# Patient Record
Sex: Female | Born: 1950 | Race: White | Hispanic: No | Marital: Married | Smoking: Never smoker
Health system: Southern US, Community
[De-identification: ages and names within clinical notes are randomized; demographics above are authoritative.]

## PROBLEM LIST (undated history)

## (undated) DIAGNOSIS — E785 Hyperlipidemia, unspecified: Secondary | ICD-10-CM

## (undated) DIAGNOSIS — H409 Unspecified glaucoma: Secondary | ICD-10-CM

## (undated) DIAGNOSIS — M1990 Unspecified osteoarthritis, unspecified site: Secondary | ICD-10-CM

## (undated) HISTORY — DX: Hyperlipidemia, unspecified: E78.5

## (undated) HISTORY — PX: TUBAL LIGATION: SHX77

## (undated) HISTORY — DX: Unspecified glaucoma: H40.9

## (undated) HISTORY — DX: Unspecified osteoarthritis, unspecified site: M19.90

---

## 1998-05-06 ENCOUNTER — Other Ambulatory Visit: Admission: RE | Admit: 1998-05-06 | Discharge: 1998-05-06 | Payer: Self-pay | Admitting: Obstetrics and Gynecology

## 1999-06-04 ENCOUNTER — Other Ambulatory Visit: Admission: RE | Admit: 1999-06-04 | Discharge: 1999-06-04 | Payer: Self-pay | Admitting: Obstetrics and Gynecology

## 2000-08-17 ENCOUNTER — Other Ambulatory Visit: Admission: RE | Admit: 2000-08-17 | Discharge: 2000-08-17 | Payer: Self-pay | Admitting: Obstetrics and Gynecology

## 2001-08-31 ENCOUNTER — Other Ambulatory Visit: Admission: RE | Admit: 2001-08-31 | Discharge: 2001-08-31 | Payer: Self-pay | Admitting: Obstetrics and Gynecology

## 2002-09-05 ENCOUNTER — Other Ambulatory Visit: Admission: RE | Admit: 2002-09-05 | Discharge: 2002-09-05 | Payer: Self-pay | Admitting: Obstetrics and Gynecology

## 2003-05-17 ENCOUNTER — Encounter (INDEPENDENT_AMBULATORY_CARE_PROVIDER_SITE_OTHER): Payer: Self-pay | Admitting: Specialist

## 2003-05-17 ENCOUNTER — Ambulatory Visit (HOSPITAL_BASED_OUTPATIENT_CLINIC_OR_DEPARTMENT_OTHER): Admission: RE | Admit: 2003-05-17 | Discharge: 2003-05-17 | Payer: Self-pay | Admitting: Obstetrics and Gynecology

## 2003-05-17 ENCOUNTER — Ambulatory Visit (HOSPITAL_COMMUNITY): Admission: RE | Admit: 2003-05-17 | Discharge: 2003-05-17 | Payer: Self-pay | Admitting: Obstetrics and Gynecology

## 2003-09-13 ENCOUNTER — Other Ambulatory Visit: Admission: RE | Admit: 2003-09-13 | Discharge: 2003-09-13 | Payer: Self-pay | Admitting: Family Medicine

## 2003-12-07 ENCOUNTER — Ambulatory Visit (HOSPITAL_COMMUNITY): Admission: RE | Admit: 2003-12-07 | Discharge: 2003-12-07 | Payer: Self-pay | Admitting: Gastroenterology

## 2004-11-06 ENCOUNTER — Other Ambulatory Visit: Admission: RE | Admit: 2004-11-06 | Discharge: 2004-11-06 | Payer: Self-pay | Admitting: Family Medicine

## 2006-02-01 ENCOUNTER — Other Ambulatory Visit: Admission: RE | Admit: 2006-02-01 | Discharge: 2006-02-01 | Payer: Self-pay | Admitting: Family Medicine

## 2006-11-30 ENCOUNTER — Encounter: Admission: RE | Admit: 2006-11-30 | Discharge: 2006-11-30 | Payer: Self-pay | Admitting: Family Medicine

## 2007-02-10 ENCOUNTER — Other Ambulatory Visit: Admission: RE | Admit: 2007-02-10 | Discharge: 2007-02-10 | Payer: Self-pay | Admitting: Family Medicine

## 2007-12-01 ENCOUNTER — Encounter: Admission: RE | Admit: 2007-12-01 | Discharge: 2007-12-01 | Payer: Self-pay | Admitting: Family Medicine

## 2008-03-12 ENCOUNTER — Other Ambulatory Visit: Admission: RE | Admit: 2008-03-12 | Discharge: 2008-03-12 | Payer: Self-pay | Admitting: Family Medicine

## 2008-12-04 ENCOUNTER — Encounter: Admission: RE | Admit: 2008-12-04 | Discharge: 2008-12-04 | Payer: Self-pay | Admitting: Family Medicine

## 2009-12-05 ENCOUNTER — Encounter: Admission: RE | Admit: 2009-12-05 | Discharge: 2009-12-05 | Payer: Self-pay | Admitting: Family Medicine

## 2010-07-18 NOTE — Op Note (Signed)
NAME:  Christina Higgins, Christina Higgins                     ACCOUNT NO.:  [id number]   MEDICAL RECORD NO.:  [id number]                   PATIENT TYPE:  AMB   LOCATION:  [facility]                                 FACILITY:  [facility]   PHYSICIAN:  Daniel L. Eda Paschal, M.D.           DATE OF BIRTH:  12/14/1950   DATE OF PROCEDURE:  05/17/2003  DATE OF DISCHARGE:                                 OPERATIVE REPORT   PREOPERATIVE DIAGNOSES:  1. Postmenopausal bleeding.  2. Endometrial polyp.   POSTOPERATIVE DIAGNOSES:  1. Postmenopausal bleeding.  2. Endometrial polyp.   OPERATION:  Hysteroscopy with excision of endometrial polyp.   SURGEON:  Dr. Eda Paschal.   ANESTHESIA:  General.   INDICATIONS:  The patient is a 61 year old, gravida 2, para 2, AB 2, who  present to the office with postmenopausal bleeding.  Endometrial biopsy was  benign.  SIH was done because a portion of the biopsy showed a suggestion of  an endometrial polyp.  On SIH, the patient had a 2 cm endometrial polyp  which was still in place in spite of the biopsy.  She now enters the  hospital for the excision of the above.   FINDINGS:  External and vaginal exam was normal.  Cervix is clean.  Uterus  is top normal size and shape with 1.5 degrees of uterine descensus.  Adnexa  are palpably normal.  At the time of hysteroscopy, the patient had a large,  2 cm endometrial polyp coming off the left lateral wall of the fundus.  Once  this was removed, top of the fundus, tubal ostia, anterior and posterior  walls of the fundus, lower uterine segment, and endocervical canal were free  of disease.   DESCRIPTION OF PROCEDURE:  After adequate general anesthesia, the patient  was placed in the dorsal lithotomy position, prepped and draped in the usual  sterile manner.  A single-tooth tenaculum was placed on the anterior lip of  the cervix.  The cervix was dilated to a #31 Pratt dilator.  A hysteroscopic  examination was done with the hysteroscope  resectoscope, using 3% sorbitol  to expand the intrauterine cavity and a camera for magnification.  Findings  were as noted above.  A 90-degree wire loop with setting 60 coags, 70 cut  was utilized, and the polyp was excised with the 90-degree wire loop and  sent to pathology for tissue diagnosis.  There was no bleeding noted.  There  was no other endometrium that required sampling.  The procedure was  terminated.  Fluid deficit was less than 100 mL.  Blood loss was less than  20 mL.  The patient tolerated the procedure well and left the operating room  in satisfactory condition.  Daniel L. Eda Paschal, M.D.    Tonette Bihari  D:  05/17/2003  T:  05/17/2003  Job:  [id number]

## 2010-07-18 NOTE — Op Note (Signed)
NAMECHRISHELLE, ZITO           ACCOUNT NO.:  [id number]   MEDICAL RECORD NO.:  [id number]          PATIENT TYPE:  AMB   LOCATION:  ENDO                         FACILITY:  [facility]   PHYSICIAN:  Graylin Shiver, M.D.   DATE OF BIRTH:  1950/04/27   DATE OF PROCEDURE:  12/07/2003  DATE OF DISCHARGE:                                 OPERATIVE REPORT   PROCEDURE:  Colonoscopy.   INDICATION:  Screening.   Informed consent was obtained after explanation of the risks of bleeding,  infection, and perforation.   PREMEDICATIONS:  1.  Fentanyl 100 mcg IV.  2.  Versed 10 mg IV.   DESCRIPTION OF PROCEDURE:  With the patient in the left lateral decubitus  position, a rectal exam was performed.  No masses were felt.  The Olympus  colonoscope was inserted into the rectum and advanced around the colon to  the cecum.  Cecal landmarks were identified.  The cecum showed a small focal  telangiectasia.  The ascending colon was normal.  The transverse colon was  normal.  The descending colon, sigmoid, and rectum were normal.  She  tolerated the procedure well without complications.   IMPRESSION:  Small focal telangiectasia in the cecum.  Otherwise normal  colonoscopy to the cecum.   I would recommend a follow up screening colonoscopy again in 10 years.      SFG/MEDQ  D:  12/07/2003  T:  12/07/2003  Job:  [id number]   cc:   Stacie Acres. White, M.D.  [address]  [place]  [place] [address]  Fax: [phone]

## 2010-12-17 ENCOUNTER — Other Ambulatory Visit: Payer: Self-pay | Admitting: Family Medicine

## 2010-12-17 DIAGNOSIS — Z1231 Encounter for screening mammogram for malignant neoplasm of breast: Secondary | ICD-10-CM

## 2010-12-24 ENCOUNTER — Ambulatory Visit
Admission: RE | Admit: 2010-12-24 | Discharge: 2010-12-24 | Disposition: A | Discharge status: Home / Self Care | Payer: Federal, State, Local not specified - PPO | Source: Ambulatory Visit | Attending: Family Medicine | Admitting: Family Medicine

## 2010-12-24 DIAGNOSIS — Z1231 Encounter for screening mammogram for malignant neoplasm of breast: Secondary | ICD-10-CM

## 2011-03-30 ENCOUNTER — Other Ambulatory Visit: Payer: Self-pay | Admitting: Family Medicine

## 2011-03-30 ENCOUNTER — Other Ambulatory Visit (HOSPITAL_COMMUNITY)
Admission: RE | Admit: 2011-03-30 | Discharge: 2011-03-30 | Disposition: A | Discharge status: Home / Self Care | Payer: Federal, State, Local not specified - PPO | Source: Ambulatory Visit | Attending: Family Medicine | Admitting: Family Medicine

## 2011-03-30 DIAGNOSIS — Z0000 Encounter for general adult medical examination without abnormal findings: Secondary | ICD-10-CM | POA: Insufficient documentation

## 2011-12-31 ENCOUNTER — Other Ambulatory Visit: Payer: Self-pay | Admitting: Family Medicine

## 2011-12-31 DIAGNOSIS — Z1231 Encounter for screening mammogram for malignant neoplasm of breast: Secondary | ICD-10-CM

## 2012-02-02 ENCOUNTER — Ambulatory Visit
Admission: RE | Admit: 2012-02-02 | Discharge: 2012-02-02 | Disposition: A | Discharge status: Home / Self Care | Payer: Federal, State, Local not specified - PPO | Source: Ambulatory Visit | Attending: Family Medicine | Admitting: Family Medicine

## 2012-02-02 DIAGNOSIS — Z1231 Encounter for screening mammogram for malignant neoplasm of breast: Secondary | ICD-10-CM

## 2013-01-23 ENCOUNTER — Other Ambulatory Visit: Payer: Self-pay

## 2013-01-23 DIAGNOSIS — Z1231 Encounter for screening mammogram for malignant neoplasm of breast: Secondary | ICD-10-CM

## 2013-02-24 ENCOUNTER — Ambulatory Visit
Admission: RE | Admit: 2013-02-24 | Discharge: 2013-02-24 | Disposition: A | Discharge status: Home / Self Care | Payer: Federal, State, Local not specified - PPO | Source: Ambulatory Visit

## 2013-02-24 DIAGNOSIS — Z1231 Encounter for screening mammogram for malignant neoplasm of breast: Secondary | ICD-10-CM

## 2014-02-05 ENCOUNTER — Other Ambulatory Visit: Payer: Self-pay

## 2014-02-05 DIAGNOSIS — Z1231 Encounter for screening mammogram for malignant neoplasm of breast: Secondary | ICD-10-CM

## 2014-03-07 ENCOUNTER — Ambulatory Visit
Admission: RE | Admit: 2014-03-07 | Discharge: 2014-03-07 | Disposition: A | Discharge status: Home / Self Care | Payer: Federal, State, Local not specified - PPO | Source: Ambulatory Visit

## 2014-03-07 DIAGNOSIS — Z1231 Encounter for screening mammogram for malignant neoplasm of breast: Secondary | ICD-10-CM

## 2014-05-14 ENCOUNTER — Other Ambulatory Visit: Payer: Self-pay | Admitting: Family Medicine

## 2014-05-14 ENCOUNTER — Other Ambulatory Visit (HOSPITAL_COMMUNITY)
Admission: RE | Admit: 2014-05-14 | Discharge: 2014-05-14 | Disposition: A | Discharge status: Home / Self Care | Payer: Federal, State, Local not specified - PPO | Source: Ambulatory Visit | Attending: Family Medicine | Admitting: Family Medicine

## 2014-05-14 DIAGNOSIS — Z124 Encounter for screening for malignant neoplasm of cervix: Secondary | ICD-10-CM | POA: Insufficient documentation

## 2014-05-16 LAB — CYTOLOGY - PAP

## 2015-02-25 ENCOUNTER — Ambulatory Visit (INDEPENDENT_AMBULATORY_CARE_PROVIDER_SITE_OTHER): Payer: Federal, State, Local not specified - PPO | Admitting: Physician Assistant

## 2015-02-25 VITALS — BP 118/70 | HR 103 | Temp 98.5°F | Resp 17 | Ht 60.0 in | Wt 153.0 lb

## 2015-02-25 DIAGNOSIS — J209 Acute bronchitis, unspecified: Secondary | ICD-10-CM | POA: Diagnosis not present

## 2015-02-25 MED ORDER — IPRATROPIUM BROMIDE 0.03 % NA SOLN: 2.0000 | Freq: Two times a day (BID) | NASAL | Status: DC

## 2015-02-25 MED ORDER — HYDROCOD POLST-CPM POLST ER 10-8 MG/5ML PO SUER: 5.0000 mL | Freq: Two times a day (BID) | ORAL | Status: DC | PRN

## 2015-02-25 MED ORDER — BENZONATATE 100 MG PO CAPS: 100.0000 mg | ORAL_CAPSULE | Freq: Three times a day (TID) | ORAL | Status: DC | PRN

## 2015-02-25 NOTE — Progress Notes (Signed)
[facility] [address], [place] [address] [phone]  Date:  02/25/2015   Name:  Christina Higgins   DOB:  1950/11/06   MRN:  [id number]  PCP:  No primary care provider on file.    Chief Complaint: Cough and URI   History of Present Illness:  This is a 64 y.o. female with PMH HLD and glaucoma who is presenting with cough x 1 week. Started with a sore throat and then cough started. Having a lot of nasal congestion, drainage is greenish/brown. States she feels more congested in her head rather than in her chest. No sob or wheezing. Having left otalgia, mild. Throat feels scratchy. No fever or chills. She states she symptoms are staying the same since onset. She states she feels "a little run down".  Aggravating/alleviating factors: taking dayquil every 4-6 hours. History of asthma: no History of env allergies: no Tobacco use: Not a smoker.   Review of Systems:  Review of Systems See HPI  There are no active problems to display for this patient.   Prior to Admission medications   Medication Sig Start Date End Date Taking? Authorizing Provider  atorvastatin (LIPITOR) 40 MG tablet Take 40 mg by mouth daily.   Yes Historical Provider, MD    Allergies  Allergen Reactions  . Septra [Sulfamethoxazole-Trimethoprim] Rash    Past Surgical History  Procedure Laterality Date  . Tubal ligation      Social History  Substance Use Topics  . Smoking status: Never Smoker   . Smokeless tobacco: None  . Alcohol Use: No    Family History  Problem Relation Age of Onset  . Cancer Mother   . Heart disease Mother   . Hypertension Mother   . Stroke Mother   . Cancer Father     Medication list has been reviewed and updated.  Physical Examination:  Physical Exam  Constitutional: She is oriented to person, place, and time. She appears well-developed and well-nourished. No distress.  HENT:  Head: Normocephalic and atraumatic.  Right Ear: Hearing,  external ear and ear canal normal. Tympanic membrane is retracted.  Left Ear: Hearing, external ear and ear canal normal. Tympanic membrane is retracted.  Nose: Nose normal. Right sinus exhibits no maxillary sinus tenderness and no frontal sinus tenderness. Left sinus exhibits no maxillary sinus tenderness and no frontal sinus tenderness.  Mouth/Throat: Uvula is midline and mucous membranes are normal. Posterior oropharyngeal erythema present. No oropharyngeal exudate or posterior oropharyngeal edema.  Eyes: Conjunctivae and lids are normal. Right eye exhibits no discharge. Left eye exhibits no discharge. No scleral icterus.  Cardiovascular: Normal rate, regular rhythm, normal heart sounds and normal pulses.   No murmur heard. Pulmonary/Chest: Effort normal and breath sounds normal. No respiratory distress. She has no wheezes. She has no rhonchi. She has no rales.  Musculoskeletal: Normal range of motion.  Lymphadenopathy:       Head (right side): No submental, no submandibular and no tonsillar adenopathy present.       Head (left side): No submental, no submandibular and no tonsillar adenopathy present.    She has no cervical adenopathy.  Neurological: She is alert and oriented to person, place, and time.  Skin: Skin is warm, dry and intact. No lesion and no rash noted.  Psychiatric: She has a normal mood and affect. Her speech is normal and behavior is normal. Thought content normal.   BP 118/70 mmHg  Pulse 103  Temp(Src) 98.5 F (36.9  C) (Oral)  Resp 17  Ht 5' (1.524 m)  Wt 153 lb (69.4 kg)  BMI 29.88 kg/m2  SpO2 97%  Assessment and Plan:  1. Acute bronchitis, unspecified organism Will treat a likely viral bronchitis and uri with tussionex, tessalon and atrovent. If symptoms not improving in 5 days, she will call and I will call in abx. - chlorpheniramine-HYDROcodone (TUSSIONEX PENNKINETIC ER) 10-8 MG/5ML SUER; Take 5 mLs by mouth every 12 (twelve) hours as needed for cough.   Dispense: 100 mL; Refill: 0 - benzonatate (TESSALON) 100 MG capsule; Take 1-2 capsules (100-200 mg total) by mouth 3 (three) times daily as needed for cough.  Dispense: 40 capsule; Refill: 0 - ipratropium (ATROVENT) 0.03 % nasal spray; Place 2 sprays into both nostrils 2 (two) times daily.  Dispense: 30 mL; Refill: 0   Shelitha Magley V. Dyke Brackett, MHS [facility]  02/25/2015

## 2015-02-25 NOTE — Progress Notes (Deleted)
[facility] [address], [place] [address] [phone]  Date:  02/25/2015   Name:  Christina Higgins   DOB:  [birth year]   MRN:  [id number]  PCP:  No primary care provider on file.    Chief Complaint: Cough and URI   History of Present Illness:  This is a 64 y.o. female with PMH HLD who is presenting with cough.  Cough: *** SOB/wheezing: *** Nasal congestion: *** Otalgia: *** Sore throat: *** Fever/chills: *** Aggravating/alleviating factors: *** History of asthma: *** History of env allergies: *** Tobacco use: ***   Review of Systems:  Review of Systems See HPI  There are no active problems to display for this patient.   Prior to Admission medications   Medication Sig Start Date End Date Taking? Authorizing Provider  atorvastatin (LIPITOR) 40 MG tablet Take 40 mg by mouth daily.   Yes Historical Provider, MD    Allergies  Allergen Reactions  . Septra [Sulfamethoxazole-Trimethoprim] Rash    Past Surgical History  Procedure Laterality Date  . Tubal ligation      Social History  Substance Use Topics  . Smoking status: Never Smoker   . Smokeless tobacco: None  . Alcohol Use: No    Family History  Problem Relation Age of Onset  . Cancer Mother   . Heart disease Mother   . Hypertension Mother   . Stroke Mother   . Cancer Father     Medication list has been reviewed and updated.  Physical Examination:  Physical Exam  BP 118/70 mmHg  Pulse 103  Temp(Src) 98.5 F (36.9 C) (Oral)  Resp 17  Ht 5' (1.524 m)  Wt 153 lb (69.4 kg)  BMI 29.88 kg/m2  SpO2 97%  Assessment and Plan:

## 2015-02-25 NOTE — Patient Instructions (Signed)
Drink plenty of water (64 oz/day) and get plenty of rest. If you have been prescribed a cough syrup, do not drive or operate heavy machinery while using this medication. If you have been prescribed a nasal spray, use twice a day. Tessalon during the day for cough. If your symptoms are not improving in 5 days, let me know and I will call in an antibiotic.

## 2015-02-28 NOTE — Addendum Note (Signed)
Addended by: Carmelina DaneANDERSON, Ynez Eugenio S on: 02/28/2015 05:24 PM   Modules accepted: Kipp BroodSmartSet

## 2015-02-28 NOTE — Progress Notes (Signed)
  Medical screening examination/treatment/procedure(s) were performed by non-physician practitioner and as supervising physician I was immediately available for consultation/collaboration.     

## 2015-03-15 ENCOUNTER — Other Ambulatory Visit (HOSPITAL_BASED_OUTPATIENT_CLINIC_OR_DEPARTMENT_OTHER): Payer: Self-pay

## 2015-03-15 DIAGNOSIS — Z1231 Encounter for screening mammogram for malignant neoplasm of breast: Secondary | ICD-10-CM

## 2015-03-28 ENCOUNTER — Ambulatory Visit
Admission: RE | Admit: 2015-03-28 | Discharge: 2015-03-28 | Disposition: A | Discharge status: Home / Self Care | Payer: Federal, State, Local not specified - PPO | Source: Ambulatory Visit

## 2015-03-28 DIAGNOSIS — Z1231 Encounter for screening mammogram for malignant neoplasm of breast: Secondary | ICD-10-CM

## 2015-04-01 ENCOUNTER — Other Ambulatory Visit: Payer: Self-pay | Admitting: Family Medicine

## 2015-04-01 DIAGNOSIS — R928 Other abnormal and inconclusive findings on diagnostic imaging of breast: Secondary | ICD-10-CM

## 2015-04-05 ENCOUNTER — Ambulatory Visit
Admission: RE | Admit: 2015-04-05 | Discharge: 2015-04-05 | Disposition: A | Discharge status: Home / Self Care | Payer: Federal, State, Local not specified - PPO | Source: Ambulatory Visit | Attending: Family Medicine | Admitting: Family Medicine

## 2015-04-05 DIAGNOSIS — R928 Other abnormal and inconclusive findings on diagnostic imaging of breast: Secondary | ICD-10-CM

## 2015-09-11 ENCOUNTER — Other Ambulatory Visit: Payer: Self-pay | Admitting: Family Medicine

## 2015-09-11 DIAGNOSIS — N6489 Other specified disorders of breast: Secondary | ICD-10-CM

## 2015-10-04 ENCOUNTER — Ambulatory Visit
Admission: RE | Admit: 2015-10-04 | Discharge: 2015-10-04 | Disposition: A | Discharge status: Home / Self Care | Payer: Federal, State, Local not specified - PPO | Source: Ambulatory Visit | Attending: Family Medicine | Admitting: Family Medicine

## 2015-10-04 DIAGNOSIS — N6489 Other specified disorders of breast: Secondary | ICD-10-CM

## 2015-10-28 ENCOUNTER — Other Ambulatory Visit: Payer: Self-pay | Admitting: Family Medicine

## 2016-02-25 ENCOUNTER — Other Ambulatory Visit: Payer: Self-pay | Admitting: Family Medicine

## 2016-02-25 DIAGNOSIS — N63 Unspecified lump in unspecified breast: Secondary | ICD-10-CM

## 2016-04-06 ENCOUNTER — Other Ambulatory Visit: Payer: Federal, State, Local not specified - PPO

## 2016-04-13 ENCOUNTER — Ambulatory Visit
Admission: RE | Admit: 2016-04-13 | Discharge: 2016-04-13 | Disposition: A | Discharge status: Home / Self Care | Payer: Federal, State, Local not specified - PPO | Source: Ambulatory Visit | Attending: Family Medicine | Admitting: Family Medicine

## 2016-04-13 DIAGNOSIS — N63 Unspecified lump in unspecified breast: Secondary | ICD-10-CM

## 2017-03-30 ENCOUNTER — Other Ambulatory Visit: Payer: Self-pay | Admitting: Family Medicine

## 2017-03-30 DIAGNOSIS — Z1231 Encounter for screening mammogram for malignant neoplasm of breast: Secondary | ICD-10-CM

## 2017-04-21 ENCOUNTER — Ambulatory Visit
Admission: RE | Admit: 2017-04-21 | Discharge: 2017-04-21 | Disposition: A | Discharge status: Home / Self Care | Payer: Federal, State, Local not specified - PPO | Source: Ambulatory Visit | Attending: Family Medicine | Admitting: Family Medicine

## 2017-04-21 DIAGNOSIS — Z1231 Encounter for screening mammogram for malignant neoplasm of breast: Secondary | ICD-10-CM

## 2017-07-15 ENCOUNTER — Other Ambulatory Visit: Payer: Self-pay | Admitting: Family Medicine

## 2017-07-15 ENCOUNTER — Other Ambulatory Visit (HOSPITAL_COMMUNITY)
Admission: RE | Admit: 2017-07-15 | Discharge: 2017-07-15 | Disposition: A | Discharge status: Home / Self Care | Payer: Federal, State, Local not specified - PPO | Source: Ambulatory Visit | Attending: Family Medicine | Admitting: Family Medicine

## 2017-07-15 DIAGNOSIS — Z124 Encounter for screening for malignant neoplasm of cervix: Secondary | ICD-10-CM | POA: Insufficient documentation

## 2017-07-16 LAB — CYTOLOGY - PAP: Diagnosis: NEGATIVE

## 2018-04-29 ENCOUNTER — Other Ambulatory Visit: Payer: Self-pay | Admitting: Family Medicine

## 2018-04-29 DIAGNOSIS — Z1231 Encounter for screening mammogram for malignant neoplasm of breast: Secondary | ICD-10-CM

## 2018-05-24 ENCOUNTER — Ambulatory Visit: Payer: Federal, State, Local not specified - PPO

## 2018-06-21 ENCOUNTER — Ambulatory Visit: Payer: Federal, State, Local not specified - PPO

## 2018-08-05 ENCOUNTER — Ambulatory Visit
Admission: RE | Admit: 2018-08-05 | Discharge: 2018-08-05 | Disposition: A | Discharge status: Home / Self Care | Payer: Federal, State, Local not specified - PPO | Source: Ambulatory Visit | Attending: Family Medicine | Admitting: Family Medicine

## 2018-08-05 ENCOUNTER — Other Ambulatory Visit: Payer: Self-pay

## 2018-08-05 DIAGNOSIS — Z1231 Encounter for screening mammogram for malignant neoplasm of breast: Secondary | ICD-10-CM

## 2018-11-03 ENCOUNTER — Other Ambulatory Visit: Payer: Self-pay

## 2018-11-03 DIAGNOSIS — Z20822 Contact with and (suspected) exposure to covid-19: Secondary | ICD-10-CM

## 2018-11-04 LAB — NOVEL CORONAVIRUS, NAA: SARS-CoV-2, NAA: NOT DETECTED

## 2019-07-24 ENCOUNTER — Encounter: Payer: Self-pay | Admitting: Family Medicine

## 2019-07-24 ENCOUNTER — Other Ambulatory Visit: Payer: Self-pay | Admitting: Family Medicine

## 2019-07-24 DIAGNOSIS — Z1231 Encounter for screening mammogram for malignant neoplasm of breast: Secondary | ICD-10-CM

## 2019-07-24 DIAGNOSIS — E119 Type 2 diabetes mellitus without complications: Secondary | ICD-10-CM

## 2019-07-24 HISTORY — DX: Type 2 diabetes mellitus without complications: E11.9

## 2019-08-08 ENCOUNTER — Ambulatory Visit
Admission: RE | Admit: 2019-08-08 | Discharge: 2019-08-08 | Disposition: A | Discharge status: Home / Self Care | Payer: Medicare Other | Source: Ambulatory Visit | Attending: Family Medicine | Admitting: Family Medicine

## 2019-08-08 ENCOUNTER — Other Ambulatory Visit: Payer: Self-pay

## 2019-08-08 DIAGNOSIS — Z1231 Encounter for screening mammogram for malignant neoplasm of breast: Secondary | ICD-10-CM

## 2019-08-10 ENCOUNTER — Other Ambulatory Visit: Payer: Self-pay | Admitting: Family Medicine

## 2019-08-10 DIAGNOSIS — R928 Other abnormal and inconclusive findings on diagnostic imaging of breast: Secondary | ICD-10-CM

## 2019-08-15 ENCOUNTER — Ambulatory Visit: Payer: Medicare Other

## 2019-08-21 ENCOUNTER — Other Ambulatory Visit: Payer: Self-pay

## 2019-08-21 ENCOUNTER — Other Ambulatory Visit: Payer: Self-pay | Admitting: Family Medicine

## 2019-08-21 ENCOUNTER — Ambulatory Visit
Admission: RE | Admit: 2019-08-21 | Discharge: 2019-08-21 | Disposition: A | Discharge status: Home / Self Care | Payer: Medicare Other | Source: Ambulatory Visit | Attending: Family Medicine | Admitting: Family Medicine

## 2019-08-21 DIAGNOSIS — R928 Other abnormal and inconclusive findings on diagnostic imaging of breast: Secondary | ICD-10-CM

## 2019-08-22 ENCOUNTER — Ambulatory Visit: Payer: Medicare Other

## 2019-08-29 ENCOUNTER — Ambulatory Visit: Payer: Medicare Other

## 2019-08-30 ENCOUNTER — Encounter: Payer: Self-pay | Admitting: Dietician

## 2019-08-30 ENCOUNTER — Other Ambulatory Visit: Payer: Self-pay

## 2019-08-30 ENCOUNTER — Encounter: Payer: Medicare Other | Attending: Family Medicine | Admitting: Dietician

## 2019-08-30 DIAGNOSIS — E119 Type 2 diabetes mellitus without complications: Secondary | ICD-10-CM | POA: Diagnosis present

## 2019-08-30 NOTE — Patient Instructions (Signed)
Remember to limit carbohydrate intake to no more than 3-4 carb choices (or 45-60 grams of carbohydrates) at meals and 0-2 carb choices (or 0-30 grams of carbohydrates) at snacks.   Try to always include a source of protein with meals and snacks.   Remember the more fiber, the better!   Continue keeping an eye on nutrition facts labels and monitoring serving sizes. Good job with this!

## 2019-08-30 NOTE — Progress Notes (Signed)
Diabetes Self-Management Education  Visit Type: First/Initial  Appt. Start Time: 8:15am   Appt. End Time: 9:15am  08/30/2019  Christina Higgins, identified by name and date of birth, is a 69 y.o. female with a diagnosis of Diabetes: Type 2.   ASSESSMENT   Diabetes Self-Management Education - 08/30/19 0820      Visit Information   Visit Type First/Initial      Initial Visit   Diabetes Type Type 2    Are you currently following a meal plan? No    Are you taking your medications as prescribed? Not on Medications    Date Diagnosed 07/24/2019      Health Coping   How would you rate your overall health? Good      Psychosocial Assessment   Patient Belief/Attitude about Diabetes Motivated to manage diabetes    Self-care barriers None    Self-management support Doctor's office    Patient Concerns Nutrition/Meal planning;Weight Control    Special Needs None    Preferred Learning Style Visual    Learning Readiness Ready    How often do you need to have someone help you when you read instructions, pamphlets, or other written materials from your doctor or pharmacy? 1 - Never    What is the last grade level you completed in school? 2 years college      Complications   Last HgB A1C per patient/outside source 6.7 %   07/24/19   How often do you check your blood sugar? > 4 times/day   CGM; before breakfast/dinner, and 2 hrs after breakfast/dinner   Fasting Blood glucose range (mg/dL) 62-694    Postprandial Blood glucose range (mg/dL) 85-462;703-500    Have you had a dilated eye exam in the past 12 months? Yes    Have you had a dental exam in the past 12 months? Yes    Are you checking your feet? No      Exercise   Exercise Type Light (walking / raking leaves)    How many days per week to you exercise? 6    How many minutes per day do you exercise? 30    Total minutes per week of exercise 180      Patient Education   Previous Diabetes Education No    Disease state  Definition of  diabetes, type 1 and 2, and the diagnosis of diabetes;Factors that contribute to the development of diabetes;Explored patient's options for treatment of their diabetes    Nutrition management  Role of diet in the treatment of diabetes and the relationship between the three main macronutrients and blood glucose level;Food label reading, portion sizes and measuring food.;Carbohydrate counting;Reviewed blood glucose goals for pre and post meals and how to evaluate the patients' food intake on their blood glucose level.;Meal options for control of blood glucose level and chronic complications.    Physical activity and exercise  Role of exercise on diabetes management, blood pressure control and cardiac health.    Monitoring Taught/discussed recording of test results and interpretation of SMBG.;Interpreting lab values - A1C, lipid, urine microalbumina.;Identified appropriate SMBG and/or A1C goals.;Yearly dilated eye exam    Acute complications Taught treatment of hypoglycemia - the 15 rule.;Discussed and identified patients' treatment of hyperglycemia.    Chronic complications Relationship between chronic complications and blood glucose control    Psychosocial adjustment Role of stress on diabetes;Identified and addressed patients feelings and concerns about diabetes      Individualized Goals (developed by patient)   Nutrition Follow  meal plan discussed   3-4 carb choices per meal; 0-2 carb choices per snack; protein with meals/snacks   Physical Activity Exercise 5-7 days per week;30 minutes per day    Medications Not Applicable    Monitoring  test blood glucose pre and post meals as discussed      Outcomes   Expected Outcomes Demonstrated interest in learning. Expect positive outcomes           Individualized Plan for Diabetes Self-Management Training:  Learning Objective:  Patient will have a greater understanding of diabetes self-management. Patient education plan is to attend individual and/or  group sessions per assessed needs and concerns.   Plan:  Patient Instructions  Remember to limit carbohydrate intake to no more than 3-4 carb choices (or 45-60 grams of carbohydrates) at meals and 0-2 carb choices (or 0-30 grams of carbohydrates) at snacks.   Try to always include a source of protein with meals and snacks.   Remember the more fiber, the better!   Continue keeping an eye on nutrition facts labels and monitoring serving sizes. Good job with this!   Expected Outcomes:  Demonstrated interest in learning. Expect positive outcomes  Education material provided: ADA - How to Thrive: A Guide for Your Journey with Diabetes, Meal plan card, My Plate and Snack sheet  If problems or questions, patient to contact team via: Phone and Email  Future DSME appointment: PRN

## 2019-11-23 ENCOUNTER — Other Ambulatory Visit: Payer: Self-pay

## 2019-11-23 ENCOUNTER — Ambulatory Visit
Admission: RE | Admit: 2019-11-23 | Discharge: 2019-11-23 | Disposition: A | Discharge status: Home / Self Care | Payer: Medicare Other | Source: Ambulatory Visit | Attending: Family Medicine | Admitting: Family Medicine

## 2019-11-23 DIAGNOSIS — R928 Other abnormal and inconclusive findings on diagnostic imaging of breast: Secondary | ICD-10-CM

## 2020-05-10 ENCOUNTER — Other Ambulatory Visit: Payer: Self-pay | Admitting: Family Medicine

## 2020-05-10 DIAGNOSIS — R1032 Left lower quadrant pain: Secondary | ICD-10-CM

## 2020-05-28 ENCOUNTER — Ambulatory Visit
Admission: RE | Admit: 2020-05-28 | Discharge: 2020-05-28 | Disposition: A | Discharge status: Home / Self Care | Payer: Medicare Other | Source: Ambulatory Visit | Attending: Family Medicine | Admitting: Family Medicine

## 2020-05-28 DIAGNOSIS — R1032 Left lower quadrant pain: Secondary | ICD-10-CM

## 2020-05-28 MED ORDER — IOPAMIDOL (ISOVUE-300) INJECTION 61%
100.0000 mL | Freq: Once | INTRAVENOUS | Status: AC | PRN
  Administered 2020-05-28: 100 mL via INTRAVENOUS

## 2020-08-01 ENCOUNTER — Other Ambulatory Visit: Payer: Self-pay | Admitting: Internal Medicine

## 2020-08-01 ENCOUNTER — Other Ambulatory Visit (HOSPITAL_COMMUNITY)
Admission: RE | Admit: 2020-08-01 | Discharge: 2020-08-01 | Disposition: A | Discharge status: Home / Self Care | Payer: Medicare Other | Source: Ambulatory Visit | Attending: Family Medicine | Admitting: Family Medicine

## 2020-08-01 ENCOUNTER — Other Ambulatory Visit: Payer: Self-pay | Admitting: Family Medicine

## 2020-08-01 DIAGNOSIS — Z1151 Encounter for screening for human papillomavirus (HPV): Secondary | ICD-10-CM | POA: Diagnosis not present

## 2020-08-01 DIAGNOSIS — M8580 Other specified disorders of bone density and structure, unspecified site: Secondary | ICD-10-CM

## 2020-08-01 DIAGNOSIS — M8588 Other specified disorders of bone density and structure, other site: Secondary | ICD-10-CM

## 2020-08-01 DIAGNOSIS — Z01419 Encounter for gynecological examination (general) (routine) without abnormal findings: Secondary | ICD-10-CM | POA: Insufficient documentation

## 2020-08-02 LAB — CYTOLOGY - PAP
Comment: NEGATIVE
Diagnosis: NEGATIVE
High risk HPV: NEGATIVE

## 2020-08-23 ENCOUNTER — Other Ambulatory Visit: Payer: Self-pay | Admitting: Family Medicine

## 2020-08-23 DIAGNOSIS — Z1231 Encounter for screening mammogram for malignant neoplasm of breast: Secondary | ICD-10-CM

## 2020-10-17 ENCOUNTER — Other Ambulatory Visit: Payer: Self-pay

## 2020-10-17 ENCOUNTER — Ambulatory Visit
Admission: RE | Admit: 2020-10-17 | Discharge: 2020-10-17 | Disposition: A | Discharge status: Home / Self Care | Payer: Medicare Other | Source: Ambulatory Visit | Attending: Family Medicine | Admitting: Family Medicine

## 2020-10-17 DIAGNOSIS — Z1231 Encounter for screening mammogram for malignant neoplasm of breast: Secondary | ICD-10-CM

## 2021-01-14 ENCOUNTER — Other Ambulatory Visit: Payer: Self-pay

## 2021-01-14 ENCOUNTER — Emergency Department (HOSPITAL_BASED_OUTPATIENT_CLINIC_OR_DEPARTMENT_OTHER): Payer: Medicare Other | Admitting: Radiology

## 2021-01-14 ENCOUNTER — Emergency Department (HOSPITAL_BASED_OUTPATIENT_CLINIC_OR_DEPARTMENT_OTHER)
Admission: EM | Admit: 2021-01-14 | Discharge: 2021-01-14 | Disposition: A | Discharge status: Home / Self Care | Payer: Medicare Other | Source: Home / Self Care | Attending: Emergency Medicine | Admitting: Emergency Medicine

## 2021-01-14 ENCOUNTER — Encounter (HOSPITAL_BASED_OUTPATIENT_CLINIC_OR_DEPARTMENT_OTHER): Payer: Self-pay

## 2021-01-14 DIAGNOSIS — R0981 Nasal congestion: Secondary | ICD-10-CM | POA: Insufficient documentation

## 2021-01-14 DIAGNOSIS — Z20822 Contact with and (suspected) exposure to covid-19: Secondary | ICD-10-CM | POA: Insufficient documentation

## 2021-01-14 DIAGNOSIS — R0902 Hypoxemia: Secondary | ICD-10-CM | POA: Diagnosis not present

## 2021-01-14 DIAGNOSIS — J3489 Other specified disorders of nose and nasal sinuses: Secondary | ICD-10-CM | POA: Insufficient documentation

## 2021-01-14 DIAGNOSIS — R519 Headache, unspecified: Secondary | ICD-10-CM | POA: Insufficient documentation

## 2021-01-14 DIAGNOSIS — R059 Cough, unspecified: Secondary | ICD-10-CM | POA: Insufficient documentation

## 2021-01-14 DIAGNOSIS — Z79899 Other long term (current) drug therapy: Secondary | ICD-10-CM | POA: Insufficient documentation

## 2021-01-14 DIAGNOSIS — E119 Type 2 diabetes mellitus without complications: Secondary | ICD-10-CM | POA: Insufficient documentation

## 2021-01-14 DIAGNOSIS — J189 Pneumonia, unspecified organism: Secondary | ICD-10-CM

## 2021-01-14 DIAGNOSIS — J8489 Other specified interstitial pulmonary diseases: Secondary | ICD-10-CM | POA: Diagnosis not present

## 2021-01-14 LAB — CBC
HCT: 40.1 % (ref 36.0–46.0)
Hemoglobin: 13 g/dL (ref 12.0–15.0)
MCH: 31 pg (ref 26.0–34.0)
MCHC: 32.4 g/dL (ref 30.0–36.0)
MCV: 95.5 fL (ref 80.0–100.0)
Platelets: 217 10*3/uL (ref 150–400)
RBC: 4.2 MIL/uL (ref 3.87–5.11)
RDW: 14.2 % (ref 11.5–15.5)
WBC: 4.7 10*3/uL (ref 4.0–10.5)
nRBC: 0 % (ref 0.0–0.2)

## 2021-01-14 LAB — COMPREHENSIVE METABOLIC PANEL
ALT: 22 U/L (ref 0–44)
AST: 31 U/L (ref 15–41)
Albumin: 3.8 g/dL (ref 3.5–5.0)
Alkaline Phosphatase: 50 U/L (ref 38–126)
Anion gap: 9 (ref 5–15)
BUN: 17 mg/dL (ref 8–23)
CO2: 28 mmol/L (ref 22–32)
Calcium: 9.5 mg/dL (ref 8.9–10.3)
Chloride: 102 mmol/L (ref 98–111)
Creatinine, Ser: 0.71 mg/dL (ref 0.44–1.00)
GFR, Estimated: >60 mL/min (ref >60)
Glucose, Bld: 137 mg/dL — ABNORMAL HIGH (ref 70–99)
Potassium: 4.2 mmol/L (ref 3.5–5.1)
Sodium: 139 mmol/L (ref 135–145)
Total Bilirubin: 0.7 mg/dL (ref 0.3–1.2)
Total Protein: 6.5 g/dL (ref 6.5–8.1)

## 2021-01-14 LAB — RESP PANEL BY RT-PCR (FLU A&B, COVID) ARPGX2
Influenza A by PCR: NEGATIVE
Influenza B by PCR: NEGATIVE
SARS Coronavirus 2 by RT PCR: NEGATIVE

## 2021-01-14 LAB — LACTIC ACID, PLASMA: Lactic Acid, Venous: 1.4 mmol/L (ref 0.5–1.9)

## 2021-01-14 MED ORDER — AZITHROMYCIN 250 MG PO TABS: 250.0000 mg | ORAL_TABLET | Freq: Every day | ORAL | 0 refills | Status: DC

## 2021-01-14 MED ORDER — GUAIFENESIN 100 MG/5ML PO LIQD
10.0000 mL | Freq: Once | ORAL | Status: AC
  Administered 2021-01-14: 10 mL via ORAL
  Filled 2021-01-14: qty 10

## 2021-01-14 MED ORDER — ACETAMINOPHEN 500 MG PO TABS
1000.0000 mg | ORAL_TABLET | Freq: Once | ORAL | Status: AC
  Administered 2021-01-14: 1000 mg via ORAL
  Filled 2021-01-14: qty 2

## 2021-01-14 MED ORDER — SODIUM CHLORIDE 0.9 % IV SOLN
500.0000 mg | Freq: Once | INTRAVENOUS | Status: AC
  Administered 2021-01-14: 500 mg via INTRAVENOUS
  Filled 2021-01-14: qty 500

## 2021-01-14 MED ORDER — CEFDINIR 300 MG PO CAPS: 300.0000 mg | ORAL_CAPSULE | Freq: Two times a day (BID) | ORAL | 0 refills | Status: DC

## 2021-01-14 MED ORDER — SODIUM CHLORIDE 0.9 % IV BOLUS
500.0000 mL | Freq: Once | INTRAVENOUS | Status: AC
  Administered 2021-01-14: 500 mL via INTRAVENOUS

## 2021-01-14 MED ORDER — SODIUM CHLORIDE 0.9 % IV SOLN
1.0000 g | Freq: Once | INTRAVENOUS | Status: AC
  Administered 2021-01-14: 1 g via INTRAVENOUS
  Filled 2021-01-14: qty 10

## 2021-01-14 NOTE — Discharge Instructions (Addendum)
It was our pleasure to provide your ER care today - we hope that you feel better.  Drink plenty of fluids/stay well hydrated.   Take antibiotics (omnicef and zithromax) as prescribed.   Follow up closely with your doctor in the next 2-3 days.   Return to ER if worse, new symptoms, increased trouble breathing, chest pain, weak/fainting, new/severe pain,  or other concern.

## 2021-01-14 NOTE — ED Triage Notes (Signed)
Pt. States went to urgent care 15 days ago and was prescribed amoxicillin for 14 days took antibiotics and did not clear sinus infection up. Pt. Stare facial pain has been going on for week. Went to urgent care Sunday was prescribed doxycycline and flonase. States medication makes her shaky. Pt. Sates has facial pain, headache, and coughing.

## 2021-01-14 NOTE — ED Provider Notes (Signed)
[facility] EMERGENCY DEPT Provider Note   CSN: [id number] Arrival date & time: 01/14/21  1847     History Chief Complaint  Patient presents with   Facial Pain   Cough   Headache    Kent A Hanus is a 70 y.o. female.  Patient c/o non prod cough, congestion. States initially two weeks ago went to urgent care and had rx amox, then went back last weekend and was dx with possible sinus infection and given rxn doxycycline - has been taking 2x/day. States non prod cough persists, and wants to make sure doesn't have pna/cxr. Denies known covid or flu exposure. No sore throat or trouble swallowing. No sob. No chest pain. No abd pain or nvd. No gu c/o.   The history is provided by the patient and medical records.  Cough Associated symptoms: headaches and rhinorrhea   Associated symptoms: no chest pain, no fever, no rash and no shortness of breath   Headache Associated symptoms: congestion and cough   Associated symptoms: no abdominal pain, no back pain, no diarrhea, no fever, no neck pain, no neck stiffness and no vomiting       Past Medical History:  Diagnosis Date   Arthritis    Diabetes mellitus without complication (HCC) 07/24/2019   Glaucoma    Hyperlipidemia     Patient Active Problem List   Diagnosis Date Noted   Type 2 diabetes mellitus without complication (HCC) 08/30/2019    Past Surgical History:  Procedure Laterality Date   TUBAL LIGATION       OB History   No obstetric history on file.     Family History  Problem Relation Age of Onset   Cancer Mother    Heart disease Mother    Hypertension Mother    Stroke Mother    Breast cancer Mother        late 44's, but did have 3 times   Cancer Father     Social History   Tobacco Use   Smoking status: Never   Smokeless tobacco: Never  Substance Use Topics   Alcohol use: No    Alcohol/week: 0.0 standard drinks   Drug use: No    Home Medications Prior to Admission medications    Medication Sig Start Date End Date Taking? Authorizing Provider  atorvastatin (LIPITOR) 40 MG tablet Take 40 mg by mouth daily.    [provider]  benzonatate (TESSALON) 100 MG capsule Take 1-2 capsules (100-200 mg total) by mouth 3 (three) times daily as needed for cough. 02/25/15   Dorna Leitz, PA-C  chlorpheniramine-HYDROcodone (TUSSIONEX PENNKINETIC ER) 10-8 MG/5ML SUER Take 5 mLs by mouth every 12 (twelve) hours as needed for cough. 02/25/15   Dorna Leitz, PA-C  ipratropium (ATROVENT) 0.03 % nasal spray Place 2 sprays into both nostrils 2 (two) times daily. 02/25/15   Dorna Leitz, PA-C    Allergies    Septra [sulfamethoxazole-trimethoprim]  Review of Systems   Review of Systems  Constitutional:  Negative for fever.  HENT:  Positive for congestion and rhinorrhea. Negative for trouble swallowing.   Eyes:  Negative for redness.  Respiratory:  Positive for cough. Negative for shortness of breath.   Cardiovascular:  Negative for chest pain.  Gastrointestinal:  Negative for abdominal pain, diarrhea and vomiting.  Genitourinary:  Negative for dysuria and flank pain.  Musculoskeletal:  Negative for back pain, neck pain and neck stiffness.  Skin:  Negative for rash.  Neurological:  Positive for headaches.  Hematological:  Does not bruise/bleed easily.  Psychiatric/Behavioral:  Negative for confusion.    Physical Exam Updated Vital Signs BP 109/64 (BP Location: Right Arm)   Pulse (!) 121   Temp 98.5 F (36.9 C) (Oral)   Resp 18   Ht 1.499 m (4\' 11" )   Wt 62.6 kg   SpO2 96%   BMI 27.87 kg/m   Physical Exam Vitals and nursing note reviewed.  Constitutional:      Appearance: Normal appearance. She is well-developed.  HENT:     Head: Atraumatic.     Comments: No sinus or temporal tenderness.     Nose: Congestion present.     Mouth/Throat:     Mouth: Mucous membranes are moist.     Pharynx: Oropharynx is clear. No oropharyngeal exudate or posterior  oropharyngeal erythema.  Eyes:     General: No scleral icterus.    Conjunctiva/sclera: Conjunctivae normal.     Pupils: Pupils are equal, round, and reactive to light.  Neck:     Trachea: No tracheal deviation.     Comments: No stiffness or rigidity, no meningeal signs.  Cardiovascular:     Rate and Rhythm: Normal rate and regular rhythm.     Pulses: Normal pulses.     Heart sounds: Normal heart sounds. No murmur heard.   No friction rub. No gallop.  Pulmonary:     Effort: Pulmonary effort is normal. No respiratory distress.     Breath sounds: Normal breath sounds.  Abdominal:     General: Bowel sounds are normal. There is no distension.     Palpations: Abdomen is soft.     Tenderness: There is no abdominal tenderness. There is no guarding.  Genitourinary:    Comments: No cva tenderness.  Musculoskeletal:        General: No swelling or tenderness.     Cervical back: Normal range of motion and neck supple. No rigidity or tenderness. No muscular tenderness.     Left lower leg: No edema.  Lymphadenopathy:     Cervical: No cervical adenopathy.  Skin:    General: Skin is warm and dry.     Findings: No rash.  Neurological:     Mental Status: She is alert.     Comments: Alert, speech normal.   Psychiatric:        Mood and Affect: Mood normal.    ED Results / Procedures / Treatments   Labs (all labs ordered are listed, but only abnormal results are displayed) Results for orders placed or performed during the hospital encounter of 01/14/21  Resp Panel by RT-PCR (Flu A&B, Covid) Nasopharyngeal Swab   Specimen: Nasopharyngeal Swab; Nasopharyngeal(NP) swabs in vial transport medium  Result Value Ref Range   SARS Coronavirus 2 by RT PCR NEGATIVE NEGATIVE   Influenza A by PCR NEGATIVE NEGATIVE   Influenza B by PCR NEGATIVE NEGATIVE  Lactic acid, plasma  Result Value Ref Range   Lactic Acid, Venous 1.4 0.5 - 1.9 mmol/L  CBC  Result Value Ref Range   WBC 4.7 4.0 - 10.5 K/uL    RBC 4.20 3.87 - 5.11 MIL/uL   Hemoglobin 13.0 12.0 - 15.0 g/dL   HCT 01/16/21 95.2 - 84.1 %   MCV 95.5 80.0 - 100.0 fL   MCH 31.0 26.0 - 34.0 pg   MCHC 32.4 30.0 - 36.0 g/dL   RDW 32.4 40.1 - 02.7 %   Platelets 217 150 - 400 K/uL   nRBC 0.0 0.0 - 0.2 %  Comprehensive metabolic panel  Result Value Ref Range   Sodium 139 135 - 145 mmol/L   Potassium 4.2 3.5 - 5.1 mmol/L   Chloride 102 98 - 111 mmol/L   CO2 28 22 - 32 mmol/L   Glucose, Bld 137 (H) 70 - 99 mg/dL   BUN 17 8 - 23 mg/dL   Creatinine, Ser 6.23 0.44 - 1.00 mg/dL   Calcium 9.5 8.9 - 76.2 mg/dL   Total Protein 6.5 6.5 - 8.1 g/dL   Albumin 3.8 3.5 - 5.0 g/dL   AST 31 15 - 41 U/L   ALT 22 0 - 44 U/L   Alkaline Phosphatase 50 38 - 126 U/L   Total Bilirubin 0.7 0.3 - 1.2 mg/dL   GFR, Estimated >83 >15 mL/min   Anion gap 9 5 - 15   DG Chest 2 View  Result Date: 01/14/2021 CLINICAL DATA:  Cough EXAM: CHEST - 2 VIEW COMPARISON:  None. FINDINGS: There are multifocal asymmetric pulmonary infiltrates demonstrating a mid and lower lung zone predominance, likely infectious or inflammatory in nature. The lungs are symmetrically well expanded. No pneumothorax or pleural effusion. Cardiac size within normal limits. Pulmonary vascularity is normal. No acute bone abnormality. IMPRESSION: Multifocal pulmonary infiltrates, likely infectious or inflammatory in nature. COVID-19 pneumonia could appear in this fashion and serologic correlation may be helpful for further management. Electronically Signed   By: Helyn Numbers M.D.   On: 01/14/2021 19:38    EKG None  Radiology DG Chest 2 View  Result Date: 01/14/2021 CLINICAL DATA:  Cough EXAM: CHEST - 2 VIEW COMPARISON:  None. FINDINGS: There are multifocal asymmetric pulmonary infiltrates demonstrating a mid and lower lung zone predominance, likely infectious or inflammatory in nature. The lungs are symmetrically well expanded. No pneumothorax or pleural effusion. Cardiac size within normal limits.  Pulmonary vascularity is normal. No acute bone abnormality. IMPRESSION: Multifocal pulmonary infiltrates, likely infectious or inflammatory in nature. COVID-19 pneumonia could appear in this fashion and serologic correlation may be helpful for further management. Electronically Signed   By: Helyn Numbers M.D.   On: 01/14/2021 19:38    Procedures Procedures   Medications Ordered in ED Medications - No data to display  ED Course  I have reviewed the triage vital signs and the nursing notes.  Pertinent labs & imaging results that were available during my care of the patient were reviewed by me and considered in my medical decision making (see chart for details).    MDM Rules/Calculators/A&P                           Cxr. Labs.   Reviewed nursing notes and prior charts for additional history.   CXR reviewed/interpreted by me - bilateral infiltrates.   Labs reviewed/interpreted by me - covid and flu neg.   Given bil infiltrates, tachy, flu and covid negative, additional labs and cultures added to workup. Iv abx ordered.   Additional labs reviewed/interpreted by me - wbc and hgb normal. Lactate is normal. Renal fxn normal.   Pt is breathing comfortably. +coughing. O2 sats currently 97%.  On monitor, sinus rhythm, hr 98.   Pt currently appears stable for d/c.   Rec close pcp f/u.  Return precautions provided.         Final Clinical Impression(s) / ED Diagnoses Final diagnoses:  None    Rx / DC Orders ED Discharge Orders     None  Cathren Laine, MD 01/14/21 2137

## 2021-01-16 ENCOUNTER — Other Ambulatory Visit: Payer: Self-pay

## 2021-01-16 ENCOUNTER — Emergency Department (HOSPITAL_BASED_OUTPATIENT_CLINIC_OR_DEPARTMENT_OTHER): Payer: Medicare Other | Admitting: Radiology

## 2021-01-16 ENCOUNTER — Encounter (HOSPITAL_BASED_OUTPATIENT_CLINIC_OR_DEPARTMENT_OTHER): Payer: Self-pay | Admitting: Emergency Medicine

## 2021-01-16 ENCOUNTER — Inpatient Hospital Stay (HOSPITAL_BASED_OUTPATIENT_CLINIC_OR_DEPARTMENT_OTHER)
Admission: EM | Admit: 2021-01-16 | Discharge: 2021-01-28 | DRG: 196 | Disposition: A | Discharge status: Other Acute Inpatient Hospital | Payer: Medicare Other | Attending: Internal Medicine | Admitting: Internal Medicine

## 2021-01-16 ENCOUNTER — Emergency Department (HOSPITAL_BASED_OUTPATIENT_CLINIC_OR_DEPARTMENT_OTHER): Payer: Medicare Other

## 2021-01-16 DIAGNOSIS — D84821 Immunodeficiency due to drugs: Secondary | ICD-10-CM | POA: Diagnosis present

## 2021-01-16 DIAGNOSIS — R6510 Systemic inflammatory response syndrome (SIRS) of non-infectious origin without acute organ dysfunction: Secondary | ICD-10-CM

## 2021-01-16 DIAGNOSIS — Z8249 Family history of ischemic heart disease and other diseases of the circulatory system: Secondary | ICD-10-CM

## 2021-01-16 DIAGNOSIS — I82442 Acute embolism and thrombosis of left tibial vein: Secondary | ICD-10-CM | POA: Diagnosis not present

## 2021-01-16 DIAGNOSIS — E872 Acidosis, unspecified: Secondary | ICD-10-CM | POA: Diagnosis present

## 2021-01-16 DIAGNOSIS — J189 Pneumonia, unspecified organism: Secondary | ICD-10-CM | POA: Diagnosis not present

## 2021-01-16 DIAGNOSIS — I82432 Acute embolism and thrombosis of left popliteal vein: Secondary | ICD-10-CM | POA: Diagnosis not present

## 2021-01-16 DIAGNOSIS — H209 Unspecified iridocyclitis: Secondary | ICD-10-CM | POA: Diagnosis present

## 2021-01-16 DIAGNOSIS — I959 Hypotension, unspecified: Secondary | ICD-10-CM | POA: Diagnosis present

## 2021-01-16 DIAGNOSIS — I350 Nonrheumatic aortic (valve) stenosis: Secondary | ICD-10-CM | POA: Diagnosis present

## 2021-01-16 DIAGNOSIS — R54 Age-related physical debility: Secondary | ICD-10-CM | POA: Diagnosis present

## 2021-01-16 DIAGNOSIS — M1990 Unspecified osteoarthritis, unspecified site: Secondary | ICD-10-CM | POA: Diagnosis present

## 2021-01-16 DIAGNOSIS — U099 Post covid-19 condition, unspecified: Secondary | ICD-10-CM | POA: Diagnosis present

## 2021-01-16 DIAGNOSIS — E041 Nontoxic single thyroid nodule: Secondary | ICD-10-CM

## 2021-01-16 DIAGNOSIS — H409 Unspecified glaucoma: Secondary | ICD-10-CM | POA: Diagnosis present

## 2021-01-16 DIAGNOSIS — E119 Type 2 diabetes mellitus without complications: Secondary | ICD-10-CM | POA: Diagnosis not present

## 2021-01-16 DIAGNOSIS — I82452 Acute embolism and thrombosis of left peroneal vein: Secondary | ICD-10-CM | POA: Diagnosis not present

## 2021-01-16 DIAGNOSIS — J80 Acute respiratory distress syndrome: Secondary | ICD-10-CM | POA: Diagnosis present

## 2021-01-16 DIAGNOSIS — R197 Diarrhea, unspecified: Secondary | ICD-10-CM | POA: Diagnosis not present

## 2021-01-16 DIAGNOSIS — Z823 Family history of stroke: Secondary | ICD-10-CM

## 2021-01-16 DIAGNOSIS — J9600 Acute respiratory failure, unspecified whether with hypoxia or hypercapnia: Secondary | ICD-10-CM

## 2021-01-16 DIAGNOSIS — Z79899 Other long term (current) drug therapy: Secondary | ICD-10-CM

## 2021-01-16 DIAGNOSIS — J9621 Acute and chronic respiratory failure with hypoxia: Secondary | ICD-10-CM | POA: Diagnosis present

## 2021-01-16 DIAGNOSIS — T451X5A Adverse effect of antineoplastic and immunosuppressive drugs, initial encounter: Secondary | ICD-10-CM | POA: Diagnosis present

## 2021-01-16 DIAGNOSIS — J8489 Other specified interstitial pulmonary diseases: Principal | ICD-10-CM | POA: Diagnosis present

## 2021-01-16 DIAGNOSIS — F05 Delirium due to known physiological condition: Secondary | ICD-10-CM | POA: Diagnosis not present

## 2021-01-16 DIAGNOSIS — T394X5A Adverse effect of antirheumatics, not elsewhere classified, initial encounter: Secondary | ICD-10-CM | POA: Diagnosis present

## 2021-01-16 DIAGNOSIS — Z20822 Contact with and (suspected) exposure to covid-19: Secondary | ICD-10-CM | POA: Diagnosis present

## 2021-01-16 DIAGNOSIS — E785 Hyperlipidemia, unspecified: Secondary | ICD-10-CM | POA: Diagnosis present

## 2021-01-16 DIAGNOSIS — Z803 Family history of malignant neoplasm of breast: Secondary | ICD-10-CM

## 2021-01-16 DIAGNOSIS — R0902 Hypoxemia: Secondary | ICD-10-CM

## 2021-01-16 LAB — BRAIN NATRIURETIC PEPTIDE: B Natriuretic Peptide: 69.3 pg/mL (ref 0.0–100.0)

## 2021-01-16 LAB — COMPREHENSIVE METABOLIC PANEL
ALT: 16 U/L (ref 0–44)
AST: 34 U/L (ref 15–41)
Albumin: 3.7 g/dL (ref 3.5–5.0)
Alkaline Phosphatase: 43 U/L (ref 38–126)
Anion gap: 11 (ref 5–15)
BUN: 13 mg/dL (ref 8–23)
CO2: 23 mmol/L (ref 22–32)
Calcium: 9.4 mg/dL (ref 8.9–10.3)
Chloride: 105 mmol/L (ref 98–111)
Creatinine, Ser: 0.64 mg/dL (ref 0.44–1.00)
GFR, Estimated: >60 mL/min (ref >60)
Glucose, Bld: 177 mg/dL — ABNORMAL HIGH (ref 70–99)
Potassium: 3.8 mmol/L (ref 3.5–5.1)
Sodium: 139 mmol/L (ref 135–145)
Total Bilirubin: 0.8 mg/dL (ref 0.3–1.2)
Total Protein: 6.3 g/dL — ABNORMAL LOW (ref 6.5–8.1)

## 2021-01-16 LAB — PHOSPHORUS: Phosphorus: 2.7 mg/dL (ref 2.5–4.6)

## 2021-01-16 LAB — CBC WITH DIFFERENTIAL/PLATELET
Abs Immature Granulocytes: 0.02 10*3/uL (ref 0.00–0.07)
Basophils Absolute: 0 10*3/uL (ref 0.0–0.1)
Basophils Relative: 1 %
Eosinophils Absolute: 0.1 10*3/uL (ref 0.0–0.5)
Eosinophils Relative: 3 %
HCT: 37.8 % (ref 36.0–46.0)
Hemoglobin: 12.4 g/dL (ref 12.0–15.0)
Immature Granulocytes: 1 %
Lymphocytes Relative: 9 %
Lymphs Abs: 0.4 10*3/uL — ABNORMAL LOW (ref 0.7–4.0)
MCH: 31.5 pg (ref 26.0–34.0)
MCHC: 32.8 g/dL (ref 30.0–36.0)
MCV: 95.9 fL (ref 80.0–100.0)
Monocytes Absolute: 0.3 10*3/uL (ref 0.1–1.0)
Monocytes Relative: 7 %
Neutro Abs: 3.3 10*3/uL (ref 1.7–7.7)
Neutrophils Relative %: 79 %
Platelets: 216 10*3/uL (ref 150–400)
RBC: 3.94 MIL/uL (ref 3.87–5.11)
RDW: 13.8 % (ref 11.5–15.5)
WBC: 4.1 10*3/uL (ref 4.0–10.5)
nRBC: 0 % (ref 0.0–0.2)

## 2021-01-16 LAB — RESP PANEL BY RT-PCR (FLU A&B, COVID) ARPGX2
Influenza A by PCR: NEGATIVE
Influenza B by PCR: NEGATIVE
SARS Coronavirus 2 by RT PCR: NEGATIVE

## 2021-01-16 LAB — TROPONIN I (HIGH SENSITIVITY)
Troponin I (High Sensitivity): 5 ng/L (ref <18)
Troponin I (High Sensitivity): 6 ng/L (ref <18)

## 2021-01-16 LAB — HEMOGLOBIN A1C
Hgb A1c MFr Bld: 6.1 % — ABNORMAL HIGH (ref 4.8–5.6)
Mean Plasma Glucose: 128.37 mg/dL

## 2021-01-16 LAB — PROCALCITONIN: Procalcitonin: 1.28 ng/mL

## 2021-01-16 LAB — GLUCOSE, CAPILLARY: Glucose-Capillary: 148 mg/dL — ABNORMAL HIGH (ref 70–99)

## 2021-01-16 LAB — CBG MONITORING, ED: Glucose-Capillary: 140 mg/dL — ABNORMAL HIGH (ref 70–99)

## 2021-01-16 LAB — LACTIC ACID, PLASMA
Lactic Acid, Venous: 2.1 mmol/L (ref 0.5–1.9)
Lactic Acid, Venous: 1.6 mmol/L (ref 0.5–1.9)

## 2021-01-16 LAB — MAGNESIUM: Magnesium: 1.9 mg/dL (ref 1.7–2.4)

## 2021-01-16 MED ORDER — SENNOSIDES-DOCUSATE SODIUM 8.6-50 MG PO TABS: 1.0000 | ORAL_TABLET | Freq: Every evening | ORAL | Status: DC | PRN

## 2021-01-16 MED ORDER — FLUTICASONE PROPIONATE 50 MCG/ACT NA SUSP
1.0000 | Freq: Every day | NASAL | Status: DC
  Administered 2021-01-16 – 2021-01-28 (×13): 1 via NASAL
  Filled 2021-01-16: qty 16

## 2021-01-16 MED ORDER — INSULIN ASPART 100 UNIT/ML IJ SOLN
0.0000 [IU] | Freq: Three times a day (TID) | INTRAMUSCULAR | Status: DC
  Administered 2021-01-18 – 2021-01-19 (×2): 1 [IU] via SUBCUTANEOUS
  Administered 2021-01-19: 2 [IU] via SUBCUTANEOUS
  Administered 2021-01-19 – 2021-01-20 (×3): 1 [IU] via SUBCUTANEOUS
  Administered 2021-01-20: 2 [IU] via SUBCUTANEOUS
  Administered 2021-01-21: 1 [IU] via SUBCUTANEOUS
  Administered 2021-01-21: 2 [IU] via SUBCUTANEOUS
  Administered 2021-01-21: 1 [IU] via SUBCUTANEOUS
  Administered 2021-01-22 (×2): 2 [IU] via SUBCUTANEOUS
  Administered 2021-01-23 – 2021-01-24 (×2): 3 [IU] via SUBCUTANEOUS
  Administered 2021-01-24 – 2021-01-25 (×3): 2 [IU] via SUBCUTANEOUS
  Administered 2021-01-26: 12:00:00 1 [IU] via SUBCUTANEOUS
  Administered 2021-01-26: 17:00:00 7 [IU] via SUBCUTANEOUS
  Administered 2021-01-27: 11:00:00 1 [IU] via SUBCUTANEOUS
  Administered 2021-01-27: 18:00:00 5 [IU] via SUBCUTANEOUS
  Administered 2021-01-28: 2 [IU] via SUBCUTANEOUS

## 2021-01-16 MED ORDER — LATANOPROST 0.005 % OP SOLN
1.0000 [drp] | Freq: Every day | OPHTHALMIC | Status: DC
  Administered 2021-01-16 – 2021-01-27 (×12): 1 [drp] via OPHTHALMIC
  Filled 2021-01-16: qty 2.5

## 2021-01-16 MED ORDER — VANCOMYCIN HCL 500 MG/100ML IV SOLN
500.0000 mg | Freq: Two times a day (BID) | INTRAVENOUS | Status: DC
  Filled 2021-01-16: qty 100

## 2021-01-16 MED ORDER — ACETAMINOPHEN 325 MG PO TABS
650.0000 mg | ORAL_TABLET | Freq: Four times a day (QID) | ORAL | Status: DC | PRN
  Administered 2021-01-16 – 2021-01-17 (×2): 650 mg via ORAL
  Filled 2021-01-16 (×2): qty 2

## 2021-01-16 MED ORDER — SODIUM CHLORIDE 0.9 % IV SOLN
2.0000 g | Freq: Once | INTRAVENOUS | Status: AC
  Administered 2021-01-16: 10:00:00 2 g via INTRAVENOUS
  Filled 2021-01-16: qty 20

## 2021-01-16 MED ORDER — SODIUM CHLORIDE 0.9 % IV SOLN
INTRAVENOUS | Status: DC | PRN
  Administered 2021-01-16: 10:00:00 10 mL via INTRAVENOUS

## 2021-01-16 MED ORDER — ONDANSETRON HCL 4 MG/2ML IJ SOLN: 4.0000 mg | Freq: Four times a day (QID) | INTRAMUSCULAR | Status: DC | PRN

## 2021-01-16 MED ORDER — SODIUM CHLORIDE 0.9 % IV SOLN
INTRAVENOUS | Status: DC | PRN
  Administered 2021-01-16: 11:00:00 10 mL via INTRAVENOUS

## 2021-01-16 MED ORDER — ACETAMINOPHEN 650 MG RE SUPP: 650.0000 mg | Freq: Four times a day (QID) | RECTAL | Status: DC | PRN

## 2021-01-16 MED ORDER — FLUOXETINE HCL 10 MG PO CAPS
10.0000 mg | ORAL_CAPSULE | Freq: Every morning | ORAL | Status: DC
  Administered 2021-01-17 – 2021-01-28 (×12): 10 mg via ORAL
  Filled 2021-01-16 (×12): qty 1

## 2021-01-16 MED ORDER — LACTATED RINGERS IV BOLUS
500.0000 mL | Freq: Once | INTRAVENOUS | Status: AC
  Administered 2021-01-16: 14:00:00 500 mL via INTRAVENOUS

## 2021-01-16 MED ORDER — LORATADINE 10 MG PO TABS: 10.0000 mg | ORAL_TABLET | Freq: Every day | ORAL | Status: DC | PRN

## 2021-01-16 MED ORDER — LACTATED RINGERS IV SOLN: INTRAVENOUS | Status: AC

## 2021-01-16 MED ORDER — ENOXAPARIN SODIUM 40 MG/0.4ML IJ SOSY
40.0000 mg | PREFILLED_SYRINGE | INTRAMUSCULAR | Status: DC
  Administered 2021-01-16 – 2021-01-26 (×11): 40 mg via SUBCUTANEOUS
  Filled 2021-01-16 (×11): qty 0.4

## 2021-01-16 MED ORDER — ADULT MULTIVITAMIN W/MINERALS CH
1.0000 | ORAL_TABLET | Freq: Every day | ORAL | Status: DC
  Administered 2021-01-17 – 2021-01-28 (×12): 1 via ORAL
  Filled 2021-01-16 (×13): qty 1

## 2021-01-16 MED ORDER — VANCOMYCIN HCL IN DEXTROSE 1-5 GM/200ML-% IV SOLN
1000.0000 mg | Freq: Once | INTRAVENOUS | Status: AC
  Administered 2021-01-16 (×2): 1000 mg via INTRAVENOUS
  Filled 2021-01-16 (×2): qty 200

## 2021-01-16 MED ORDER — VANCOMYCIN HCL 500 MG/100ML IV SOLN
500.0000 mg | Freq: Two times a day (BID) | INTRAVENOUS | Status: DC
  Administered 2021-01-17 – 2021-01-18 (×3): 500 mg via INTRAVENOUS
  Filled 2021-01-16 (×3): qty 100

## 2021-01-16 MED ORDER — AZATHIOPRINE 50 MG PO TABS
100.0000 mg | ORAL_TABLET | Freq: Every day | ORAL | Status: DC
  Administered 2021-01-16 – 2021-01-18 (×3): 100 mg via ORAL
  Filled 2021-01-16 (×3): qty 2

## 2021-01-16 MED ORDER — OMEGA 3 500 500 MG PO CAPS: 500.0000 mg | ORAL_CAPSULE | Freq: Every day | ORAL | Status: DC

## 2021-01-16 MED ORDER — TIMOLOL MALEATE 0.5 % OP SOLN
1.0000 [drp] | Freq: Two times a day (BID) | OPHTHALMIC | Status: DC
  Administered 2021-01-16 – 2021-01-28 (×23): 1 [drp] via OPHTHALMIC
  Filled 2021-01-16 (×2): qty 5

## 2021-01-16 MED ORDER — GUAIFENESIN-DM 100-10 MG/5ML PO SYRP
10.0000 mL | ORAL_SOLUTION | ORAL | Status: DC | PRN
  Administered 2021-01-16 – 2021-01-18 (×6): 10 mL via ORAL
  Filled 2021-01-16 (×6): qty 10

## 2021-01-16 MED ORDER — ONDANSETRON HCL 4 MG PO TABS: 4.0000 mg | ORAL_TABLET | Freq: Four times a day (QID) | ORAL | Status: DC | PRN

## 2021-01-16 MED ORDER — ACYCLOVIR 400 MG PO TABS
800.0000 mg | ORAL_TABLET | Freq: Every day | ORAL | Status: DC
  Administered 2021-01-16 – 2021-01-28 (×13): 800 mg via ORAL
  Filled 2021-01-16: qty 1
  Filled 2021-01-16 (×10): qty 2
  Filled 2021-01-16: qty 1
  Filled 2021-01-16 (×3): qty 2
  Filled 2021-01-16: qty 1
  Filled 2021-01-16: qty 2

## 2021-01-16 MED ORDER — ACETAMINOPHEN 500 MG PO TABS
1000.0000 mg | ORAL_TABLET | Freq: Once | ORAL | Status: AC
  Administered 2021-01-16: 14:00:00 1000 mg via ORAL
  Filled 2021-01-16: qty 2

## 2021-01-16 MED ORDER — SODIUM CHLORIDE 0.9 % IV SOLN
500.0000 mg | Freq: Once | INTRAVENOUS | Status: AC
  Administered 2021-01-16: 11:00:00 500 mg via INTRAVENOUS
  Filled 2021-01-16: qty 500

## 2021-01-16 MED ORDER — IOHEXOL 350 MG/ML SOLN
75.0000 mL | Freq: Once | INTRAVENOUS | Status: AC | PRN
  Administered 2021-01-16: 12:00:00 75 mL via INTRAVENOUS

## 2021-01-16 MED ORDER — SODIUM CHLORIDE 0.9 % IV SOLN
2.0000 g | Freq: Three times a day (TID) | INTRAVENOUS | Status: DC
  Administered 2021-01-16 – 2021-01-19 (×9): 2 g via INTRAVENOUS
  Filled 2021-01-16 (×9): qty 2

## 2021-01-16 MED ORDER — OMEGA-3-ACID ETHYL ESTERS 1 G PO CAPS
1.0000 g | ORAL_CAPSULE | Freq: Every day | ORAL | Status: DC
  Administered 2021-01-17 – 2021-01-28 (×12): 1 g via ORAL
  Filled 2021-01-16 (×14): qty 1

## 2021-01-16 MED ORDER — ATORVASTATIN CALCIUM 40 MG PO TABS
40.0000 mg | ORAL_TABLET | Freq: Every day | ORAL | Status: DC
  Administered 2021-01-17 – 2021-01-28 (×12): 40 mg via ORAL
  Filled 2021-01-16 (×13): qty 1

## 2021-01-16 MED ORDER — IPRATROPIUM-ALBUTEROL 0.5-2.5 (3) MG/3ML IN SOLN
3.0000 mL | Freq: Once | RESPIRATORY_TRACT | Status: AC
  Administered 2021-01-16: 10:00:00 3 mL via RESPIRATORY_TRACT
  Filled 2021-01-16: qty 3

## 2021-01-16 NOTE — Sepsis Progress Note (Signed)
Sepsis protocol followed by eLink 

## 2021-01-16 NOTE — ED Notes (Signed)
Attempted to call report-no answer 

## 2021-01-16 NOTE — Progress Notes (Signed)
Pharmacy Antibiotic Note  Christina Higgins is a 70 y.o. female admitted on 01/16/2021 presenting with cough, congestion and SOB.  Pharmacy has been consulted for vancomycin dosing.  Azithromycin/ceftriaxone per MD  Plan: Vancomycin 1000 mg IV x 1, then 500 mg IV q 12h (eAUC 528, Goal AUC 400-550, SCr used 0.8) Add MRSA PCR Monitor renal function, PCR/Cx to narrow Vancomycin levels as needed  Height: 4\' 11"  (149.9 cm) Weight: 62.6 kg (138 lb 0.1 oz) IBW/kg (Calculated) : 43.2  Temp (24hrs), Avg:100.3 F (37.9 C), Min:100.3 F (37.9 C), Max:100.3 F (37.9 C)  Recent Labs  Lab 01/14/21 2049 01/16/21 0917  WBC 4.7 4.1  CREATININE 0.71  --   LATICACIDVEN 1.4 2.1*    Estimated Creatinine Clearance: 53.4 mL/min (by C-G formula based on SCr of 0.71 mg/dL).    Allergies  Allergen Reactions   Septra [Sulfamethoxazole-Trimethoprim] Rash    01/18/21, PharmD Clinical Pharmacist ED Pharmacist Phone # [phone] 01/16/2021 10:14 AM

## 2021-01-16 NOTE — ED Notes (Signed)
Sent 1 set of cultures also

## 2021-01-16 NOTE — ED Notes (Signed)
2 atttempts to call report no answer on floor

## 2021-01-16 NOTE — ED Triage Notes (Signed)
Pt via pov from home with continued sob after PNA diagnosis 2 days ago. Pt has been on oral abx after receiving iv abx on Tuesday and states she is not getting better. Pt alert & oriented, able to speak in complete sentences but has a breathy speak pattern. NAD noted.

## 2021-01-16 NOTE — H&P (Signed)
History and Physical    Christina Higgins GLO:[id number] DOB: 03/23/1950 DOA: 01/16/2021  PCP: Laurann Montana, MD  Patient coming from: Home  I have personally briefly reviewed patient's old medical records in [facility]  Chief Complaint: sob, cough  HPI: Christina Higgins is a 70 y.o. 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 Pt reports of having COVID Infection in October first week. Following the infection, pt reports persistent symptoms of cold, congestion, sob and cough, she was seen by PCP and given prescription for Antibiotics for 5 days. She complete the course and continues to remain symptomatic.she was seen again and was given another course of antibiotic, without any relief . She underwent CXR showing pneumonia, sent to ED for further evaluation.  On arrival to ED, she had low grade fever of 100.3, tachypnea, tachycardic, and hypotensive,.    ED Course: labs revealed elevated lactic acid, normal cbc and BMP.  Ct angiogram of the chest revealed Bilateral patchy ground-glass opacities, right greater than left, concerning for multifocal infectious / inflammatory process.  She was given a dose of rocephin, azithromycin and vancomycin and referred to [facility] for admission.   Review of Systems: As per HPI otherwise  "All others reviewed and are negative,.   Past Medical History:  Diagnosis Date   Arthritis    Diabetes mellitus without complication (HCC) 07/24/2019   Glaucoma    Hyperlipidemia     Past Surgical History:  Procedure Laterality Date   TUBAL LIGATION      Social History  reports that she has never smoked. She has never used smokeless tobacco. She reports that she does not drink alcohol and does not use drugs.  Allergies  Allergen Reactions   Septra [Sulfamethoxazole-Trimethoprim] Rash    Family History  Problem Relation Age of Onset   Cancer  Mother    Heart disease Mother    Hypertension Mother    Stroke Mother    Breast cancer Mother        late 22's, but did have 3 times   Cancer Father    Reviewed  and not pertinent.  Prior to Admission medications   Medication Sig Start Date End Date Taking? Authorizing Provider  acyclovir (ZOVIRAX) 800 MG tablet Take 800 mg by mouth daily. 01/04/21  Yes [provider]  atorvastatin (LIPITOR) 40 MG tablet Take 40 mg by mouth daily.   Yes [provider]  azaTHIOprine (IMURAN) 50 MG tablet Take 100 mg by mouth daily. 10/28/20  Yes [provider]  azithromycin (ZITHROMAX Z-PAK) 250 MG tablet Take 1 tablet (250 mg total) by mouth daily for 4 days. 01/15/21 01/19/21 Yes Cathren Laine, MD  cefdinir (OMNICEF) 300 MG capsule Take 1 capsule (300 mg total) by mouth 2 (two) times daily. 01/14/21  Yes Cathren Laine, MD  FLUoxetine (PROZAC) 10 MG capsule Take 10 mg by mouth every morning. 01/02/21  Yes [provider]  HUMIRA PEN 40 MG/0.4ML PNKT Inject 40 mg as directed See admin instructions. Sub-q every 2 weeks 12/31/20  Yes [provider]  latanoprost (XALATAN) 0.005 % ophthalmic solution Place 1 drop into both eyes at bedtime. 12/31/20  Yes [provider]  METAMUCIL FIBER PO Take 1 capsule by mouth daily.   Yes [provider]  Multiple Vitamin (MULTIVITAMIN) tablet Take 1 tablet by mouth daily.   Yes [provider]  Omega-3 Fatty Acids (  OMEGA 3 500 PO) Take 500 mg by mouth daily.   Yes [provider]  timolol (TIMOPTIC) 0.5 % ophthalmic solution Place 1 drop into the left eye 2 (two) times daily. 06/26/19  Yes [provider]  benzonatate (TESSALON) 100 MG capsule Take 1-2 capsules (100-200 mg total) by mouth 3 (three) times daily as needed for cough. Patient not taking: Reported on 01/16/2021 02/25/15   Ezekiel Slocumb, PA-C  chlorpheniramine-HYDROcodone [facility] PENNKINETIC ER) 10-8 MG/5ML SUER Take 5 mLs  by mouth every 12 (twelve) hours as needed for cough. Patient not taking: Reported on 01/16/2021 02/25/15   Ezekiel Slocumb, PA-C  ipratropium (ATROVENT) 0.03 % nasal spray Place 2 sprays into both nostrils 2 (two) times daily. Patient not taking: Reported on 01/16/2021 02/25/15   Ezekiel Slocumb, PA-C    Physical Exam: Vitals:   01/16/21 1558 01/16/21 1653 01/16/21 1701 01/16/21 1727  BP:  (!) 114/58    Pulse:  85    Resp:  20    Temp: 98.7 F (37.1 C) 98.4 F (36.9 C)    TempSrc:  Oral    SpO2:  95%  93%  Weight:   63.2 kg   Height:   5' (1.524 m)     Constitutional: NAD, calm, comfortable Vitals:   01/16/21 1558 01/16/21 1653 01/16/21 1701 01/16/21 1727  BP:  (!) 114/58    Pulse:  85    Resp:  20    Temp: 98.7 F (37.1 C) 98.4 F (36.9 C)    TempSrc:  Oral    SpO2:  95%  93%  Weight:   63.2 kg   Height:   5' (1.524 m)    Eyes: PERRL, lids and conjunctivae normal ENMT: Mucous membranes are moist. Posterior pharynx clear of any exudate or lesions.Normal dentition.  Neck: normal, supple, no masses, no thyromegaly Respiratory: clear to auscultation bilaterally, no wheezing, no crackles. Normal respiratory effort. No accessory muscle use.  Cardiovascular: Regular rate and rhythm, no murmurs / rubs / gallops. No extremity edema. 2+ pedal pulses. No carotid bruits.  Abdomen: no tenderness, no masses palpated. No hepatosplenomegaly. Bowel sounds positive.  Musculoskeletal: no clubbing / cyanosis. No joint deformity upper and lower extremities. Good ROM, no contractures. Normal muscle tone.  Skin: no rashes, lesions, ulcers. No induration Neurologic: CN 2-12 grossly intact. Sensation intact, DTR normal. Strength 5/5 in all 4.  Psychiatric: Normal judgment and insight. Alert and oriented x 3. Normal mood.    Labs on Admission: I have personally reviewed following labs and imaging studies  CBC: Recent Labs  Lab 01/14/21 2049 01/16/21 0917  WBC 4.7 4.1  NEUTROABS  --  3.3   HGB 13.0 12.4  HCT 40.1 37.8  MCV 95.5 95.9  PLT 217 [id number]    Basic Metabolic Panel: Recent Labs  Lab 01/14/21 2049 01/16/21 0917 01/16/21 1342  NA 139 139  --   K 4.2 3.8  --   CL 102 105  --   CO2 28 23  --   GLUCOSE 137* 177*  --   BUN 17 13  --   CREATININE 0.71 0.64  --   CALCIUM 9.5 9.4  --   MG  --   --  1.9  PHOS  --   --  2.7    GFR: Estimated Creatinine Clearance: 55.1 mL/min (by C-G formula based on SCr of 0.64 mg/dL).  Liver Function Tests: Recent Labs  Lab 01/14/21 2049 01/16/21 0917  AST 31 34  ALT 22 16  ALKPHOS 50 43  BILITOT 0.7 0.8  PROT 6.5 6.3*  ALBUMIN 3.8 3.7    Urine analysis: No results found for: COLORURINE, APPEARANCEUR, LABSPEC, PHURINE, GLUCOSEU, HGBUR, BILIRUBINUR, KETONESUR, PROTEINUR, UROBILINOGEN, NITRITE, LEUKOCYTESUR  Radiological Exams on Admission: DG Chest 2 View  Result Date: 01/16/2021 CLINICAL DATA:  Shortness of breath, cough EXAM: CHEST - 2 VIEW COMPARISON:  Chest radiograph 01/14/2021 FINDINGS: The cardiomediastinal silhouette is stable. Again seen are patchy opacities in the lungs, significantly worse on the right, not significantly changed since 01/14/2021. There is no pleural effusion. There is no pneumothorax. There is no acute osseous abnormality. IMPRESSION: The opacities in the lungs, significantly worse on the right, are not significantly changed since 01/14/2021, and remain concerning for pneumonia. Recommend follow-up radiographs in 6-8 weeks to document resolution. Electronically Signed   By: Valetta Mole M.D.   On: 01/16/2021 09:36   DG Chest 2 View  Result Date: 01/14/2021 CLINICAL DATA:  Cough EXAM: CHEST - 2 VIEW COMPARISON:  None. FINDINGS: There are multifocal asymmetric pulmonary infiltrates demonstrating a mid and lower lung zone predominance, likely infectious or inflammatory in nature. The lungs are symmetrically well expanded. No pneumothorax or pleural effusion. Cardiac size within normal limits.  Pulmonary vascularity is normal. No acute bone abnormality. IMPRESSION: Multifocal pulmonary infiltrates, likely infectious or inflammatory in nature. COVID-19 pneumonia could appear in this fashion and serologic correlation may be helpful for further management. Electronically Signed   By: Fidela Salisbury M.D.   On: 01/14/2021 19:38   CT Angio Chest PE W/Cm &/Or Wo Cm  Result Date: 01/16/2021 CLINICAL DATA:  Shortness of breath EXAM: CT ANGIOGRAPHY CHEST WITH CONTRAST TECHNIQUE: Multidetector CT imaging of the chest was performed using the standard protocol during bolus administration of intravenous contrast. Multiplanar CT image reconstructions and MIPs were obtained to evaluate the vascular anatomy. CONTRAST:  64mL OMNIPAQUE IOHEXOL 350 MG/ML SOLN COMPARISON:  None. FINDINGS: Cardiovascular: Adequate contrast opacification of the pulmonary arteries. No evidence of pulmonary embolus. No significant coronary artery calcifications or atherosclerotic disease of the thoracic aorta. Calcifications of the aortic valve. Normal heart size. No pericardial effusion. Mediastinum/Nodes: Esophagus is unremarkable. Left-sided thyroid nodule measuring 1.0 cm. Mildly enlarged mediastinal and hilar lymph nodes. Subcarinal lymph node measuring 1.4 cm in short axis on series 4, image 61, likely reactive. Lungs/Pleura: Central airways are patent. Bilateral patchy ground-glass opacities, right greater than left. No pleural effusion or pneumothorax. Upper Abdomen: No acute abnormality. Musculoskeletal: No chest wall abnormality. No acute or significant osseous findings. Review of the MIP images confirms the above findings. IMPRESSION: 1. No evidence of pulmonary embolus. 2. Bilateral patchy ground-glass opacities, right greater than left, concerning for multifocal infectious or inflammatory process. Asymmetric pulmonary edema is an additional consideration. 3. Mildly enlarged mediastinal and hilar lymph nodes, likely reactive. 4.  Calcifications of the aortic valve, which can be seen the setting of aortic stenosis or sclerosis. Consider further evaluation with echocardiography. 5. Left-sided thyroid nodule measuring 1.0 cm. Recommend further evaluation with nonemergent thyroid ultrasound. Electronically Signed   By: Yetta Glassman M.D.   On: 01/16/2021 11:59    EKG: Independently reviewed.  Sinus rhythm with non specific t wave abnormalities.   Assessment/Plan Principal Problem:   Bilateral pneumonia   Bilateral Pneumonia/ SIRS  CT chest showed Bilateral patchy ground-glass opacities, right greater than left, concerning for multifocal infectious  Pt had low grade fever of 100.3, tachycardic 100/min, tachypnea 31/min, hypotensive  90/58 mm hg and elevated lactic  acid.   Failed outpatient treatment of the pneumonia.  Follow up blood cultures.  In view of 2 failed treatment for pneumonia, started her on IV vancomycin and IV cefepime.  Sputum cultures ordered.  Urine for strep and legionella antigen ordered.  Pt is on RA with good sats.  Continue to monitor.  Cough medication. Flonase spray.    Uveitis:  Improving. On Humira every 2 weeks.    Hyperlipidemia:  Resume statin.    Diet controlled DM CBG (last 3)  Recent Labs    01/16/21 1603  GLUCAP 140*   Resume SSI.  Hemoglobin A1c is ordered.    Lactic acidosis  Resolved.   Thyroid nodule:  US of the thyroid ordered.    DVT prophylaxis:  (Lovenox) Code Status:    (Full code) Family Communication:  None at bedside.  Disposition Plan:   Patient is from:  Home   Anticipated DC to:  Home.   Anticipated DC date:  01/19/21  Anticipated DC barriers: Improvement in the pneumonia.   Consults called:  None.  Admission status:  Obs/ prog  Severity of Illness: The appropriate patient status for this patient is OBSERVATION. Observation status is judged to be reasonable and necessary in order to provide the required intensity of service to ensure  the patient's safety. The patient's presenting symptoms, physical exam findings, and initial radiographic and laboratory data in the context of their medical condition is felt to place them at decreased risk for further clinical deterioration. Furthermore, it is anticipated that the patient will be medically stable for discharge from the hospital within 2 midnights of admission.     Hosie Poisson MD [facility]  How to contact the [facility] Attending or Consulting provider [place] or covering provider during after hours [place], for this patient?   Check the care team in [facility] and look for a) attending/consulting [facility] provider listed and b) the [facility] team listed Log into [email or web address] and use 's universal password to access. If you do not have the password, please contact the hospital operator. Locate the [facility] provider you are looking for under [facility] and page to a number that you can be directly reached. If you still have difficulty reaching the provider, please page the [facility] (Director on Call) for the Hospitalists listed on amion for assistance.  01/16/2021, 6:37 PM

## 2021-01-16 NOTE — Progress Notes (Signed)
Pharmacy Antibiotic Note  Christina Higgins is a 70 y.o. female admitted on 01/16/2021 with pneumonia.  Pharmacy has been consulted for Cefepime and vancomycin dosing.  Plan: Cefepime 2g IV q8h Per previous pharmacy assessment:  Vancomycin 1000 mg IV x 1, then 500 mg IV q 12h (eAUC 528, Goal AUC 400-550, SCr used 0.8) Measure Vanc levels as needed.  Goal AUC = 400 - 550. Follow up renal function, culture results, and clinical course.   Height: 5' (152.4 cm) Weight: 63.2 kg (139 lb 5 oz) IBW/kg (Calculated) : 45.5  Temp (24hrs), Avg:99.4 F (37.4 C), Min:98.4 F (36.9 C), Max:100.3 F (37.9 C)  Recent Labs  Lab 01/14/21 2049 01/16/21 0917 01/16/21 1210  WBC 4.7 4.1  --   CREATININE 0.71 0.64  --   LATICACIDVEN 1.4 2.1* 1.6    Estimated Creatinine Clearance: 55.1 mL/min (by C-G formula based on SCr of 0.64 mg/dL).    Allergies  Allergen Reactions   Septra [Sulfamethoxazole-Trimethoprim] Rash     Thank you for allowing pharmacy to be a part of this patient's care.  Lynann Beaver PharmD, BCPS Clinical Pharmacist [facility] main pharmacy [phone] 01/16/2021 5:29 PM

## 2021-01-16 NOTE — ED Notes (Signed)
RT note: RT collected Nasal Swab/pt. tolerated well/labelled/sent to lab.

## 2021-01-16 NOTE — ED Notes (Signed)
Patient transported to CT 

## 2021-01-16 NOTE — ED Provider Notes (Signed)
[facility] [facility] EMERGENCY DEPT Provider Note   CSN: [id number] Arrival date & time: 01/16/21  0854     History Chief Complaint  Patient presents with   Shortness of Breath    Christina Higgins is a 70 y.o. female.  HPI Patient reports that she has been having problems with cough and sinus symptoms for about 3 weeks.  She reports that she has been treated but is not getting better.  She was seen 2 days ago and diagnosed with pneumonia.  She was treated with IV antibiotics ceftriaxone and Zithromax in the emergency department and discharged with Zithromax and Omnicef.  Patient reports symptoms are worsening.  She has having increasing shortness of breath and cough.  She is extremely fatigued.  Reports chest pain with cough.  No nausea no vomiting.  She continues to have fever T-max at home 100.9.  She took acetaminophen at about 6 AM this morning.  Patient has no history of prior use of inhalers.  She is has no smoking history.  She denies pain or swelling in her legs or calves.    Past Medical History:  Diagnosis Date   Arthritis    Diabetes mellitus without complication ([place]) [id number]   Glaucoma    Hyperlipidemia     Patient Active Problem List   Diagnosis Date Noted   Bilateral pneumonia 01/16/2021   Type 2 diabetes mellitus without complication ([place]) [id number]    Past Surgical History:  Procedure Laterality Date   TUBAL LIGATION       OB History   No obstetric history on file.     Family History  Problem Relation Age of Onset   Cancer Mother    Heart disease Mother    Hypertension Mother    Stroke Mother    Breast cancer Mother        late 54's, but did have 3 times   Cancer Father     Social History   Tobacco Use   Smoking status: Never   Smokeless tobacco: Never  Substance Use Topics   Alcohol use: No    Alcohol/week: 0.0 standard drinks   Drug use: No    Home Medications Prior to Admission medications   Medication Sig Start Date  End Date Taking? Authorizing Provider  acyclovir (ZOVIRAX) 800 MG tablet Take 800 mg by mouth daily. 01/04/21  Yes [provider]  atorvastatin (LIPITOR) 40 MG tablet Take 40 mg by mouth daily.   Yes [provider]  azaTHIOprine (IMURAN) 50 MG tablet Take 100 mg by mouth daily. 10/28/20  Yes [provider]  azithromycin (ZITHROMAX Z-PAK) 250 MG tablet Take 1 tablet (250 mg total) by mouth daily for 4 days. 01/15/21 01/19/21 Yes Lajean Saver, MD  cefdinir (OMNICEF) 300 MG capsule Take 1 capsule (300 mg total) by mouth 2 (two) times daily. 01/14/21  Yes Lajean Saver, MD  FLUoxetine (PROZAC) 10 MG capsule Take 10 mg by mouth every morning. 01/02/21  Yes [provider]  HUMIRA PEN 40 MG/0.4ML PNKT Inject 40 mg as directed See admin instructions. Sub-q every 2 weeks 12/31/20  Yes [provider]  latanoprost (XALATAN) 0.005 % ophthalmic solution Place 1 drop into both eyes at bedtime. 12/31/20  Yes [provider]  METAMUCIL FIBER PO Take 1 capsule by mouth daily.   Yes [provider]  Multiple Vitamin (MULTIVITAMIN) tablet Take 1 tablet by mouth daily.   Yes [provider]  Omega-3 Fatty Acids (OMEGA 3 500 PO) Take 500  mg by mouth daily.   Yes [provider]  timolol (TIMOPTIC) 0.5 % ophthalmic solution Place 1 drop into the left eye 2 (two) times daily. 06/26/19  Yes [provider]  benzonatate (TESSALON) 100 MG capsule Take 1-2 capsules (100-200 mg total) by mouth 3 (three) times daily as needed for cough. Patient not taking: Reported on 01/16/2021 02/25/15   Dorna Leitz, PA-C  chlorpheniramine-HYDROcodone [facility] PENNKINETIC ER) 10-8 MG/5ML SUER Take 5 mLs by mouth every 12 (twelve) hours as needed for cough. Patient not taking: Reported on 01/16/2021 02/25/15   Dorna Leitz, PA-C  ipratropium (ATROVENT) 0.03 % nasal spray Place 2 sprays into both nostrils 2 (two) times daily. Patient not taking:  Reported on 01/16/2021 02/25/15   Dorna Leitz, PA-C    Allergies    Septra [sulfamethoxazole-trimethoprim]  Review of Systems   Review of Systems 10 systems reviewed and negative except as per HPI Physical Exam Updated Vital Signs BP (!) 93/58 (BP Location: Right Arm)   Pulse 96   Temp 100.2 F (37.9 C) (Oral)   Resp (!) 26   Ht 4\' 11"  (1.499 m)   Wt 62.6 kg   SpO2 95%   BMI 27.87 kg/m   Physical Exam Constitutional:      Comments: Patient is alert with clear mental status.  She is ill in appearance.  She has tachypnea.  Frequent dry cough.  HENT:     Head: Normocephalic and atraumatic.     Mouth/Throat:     Pharynx: Oropharynx is clear.  Eyes:     Extraocular Movements: Extraocular movements intact.  Cardiovascular:     Comments: Tachycardia.  2 out of 6 systolic ejection murmur. Pulmonary:     Comments: Tachypnea.  Dry cough.  Crackles on the right lung field. Abdominal:     General: There is no distension.     Palpations: Abdomen is soft.     Tenderness: There is no abdominal tenderness. There is no guarding.  Musculoskeletal:        General: No swelling or tenderness. Normal range of motion.     Right lower leg: No edema.     Left lower leg: No edema.  Skin:    General: Skin is warm and dry.  Neurological:     General: No focal deficit present.     Mental Status: She is oriented to person, place, and time.     Coordination: Coordination normal.  Psychiatric:        Mood and Affect: Mood normal.    ED Results / Procedures / Treatments   Labs (all labs ordered are listed, but only abnormal results are displayed) Labs Reviewed  LACTIC ACID, PLASMA - Abnormal; Notable for the following components:      Result Value   Lactic Acid, Venous 2.1 (*)    All other components within normal limits  COMPREHENSIVE METABOLIC PANEL - Abnormal; Notable for the following components:   Glucose, Bld 177 (*)    Total Protein 6.3 (*)    All other components within  normal limits  CBC WITH DIFFERENTIAL/PLATELET - Abnormal; Notable for the following components:   Lymphs Abs 0.4 (*)    All other components within normal limits  RESP PANEL BY RT-PCR (FLU A&B, COVID) ARPGX2  CULTURE, BLOOD (SINGLE)  MRSA NEXT GEN BY PCR, NASAL  LACTIC ACID, PLASMA  BRAIN NATRIURETIC PEPTIDE  URINALYSIS, ROUTINE W REFLEX MICROSCOPIC  PROCALCITONIN  MAGNESIUM  PHOSPHORUS  TROPONIN I (HIGH SENSITIVITY)  TROPONIN I (HIGH SENSITIVITY)    EKG EKG Interpretation  Date/Time:  Thursday January 16 2021 09:07:42 EST Ventricular Rate:  102 PR Interval:  144 QRS Duration: 84 QT Interval:  330 QTC Calculation: 430 R Axis:   -9 Text Interpretation: Sinus tachycardia LVH by voltage Borderline T abnormalities, anterior leads voltage change from previous, question lead placement. no acute ischemic appearance Confirmed by Charlesetta Shanks [phone]) on 01/16/2021 1:27:53 PM  Radiology DG Chest 2 View  Result Date: 01/16/2021 CLINICAL DATA:  Shortness of breath, cough EXAM: CHEST - 2 VIEW COMPARISON:  Chest radiograph 01/14/2021 FINDINGS: The cardiomediastinal silhouette is stable. Again seen are patchy opacities in the lungs, significantly worse on the right, not significantly changed since 01/14/2021. There is no pleural effusion. There is no pneumothorax. There is no acute osseous abnormality. IMPRESSION: The opacities in the lungs, significantly worse on the right, are not significantly changed since 01/14/2021, and remain concerning for pneumonia. Recommend follow-up radiographs in 6-8 weeks to document resolution. Electronically Signed   By: Valetta Mole M.D.   On: 01/16/2021 09:36   DG Chest 2 View  Result Date: 01/14/2021 CLINICAL DATA:  Cough EXAM: CHEST - 2 VIEW COMPARISON:  None. FINDINGS: There are multifocal asymmetric pulmonary infiltrates demonstrating a mid and lower lung zone predominance, likely infectious or inflammatory in nature. The lungs are symmetrically well  expanded. No pneumothorax or pleural effusion. Cardiac size within normal limits. Pulmonary vascularity is normal. No acute bone abnormality. IMPRESSION: Multifocal pulmonary infiltrates, likely infectious or inflammatory in nature. COVID-19 pneumonia could appear in this fashion and serologic correlation may be helpful for further management. Electronically Signed   By: Fidela Salisbury M.D.   On: 01/14/2021 19:38   CT Angio Chest PE W/Cm &/Or Wo Cm  Result Date: 01/16/2021 CLINICAL DATA:  Shortness of breath EXAM: CT ANGIOGRAPHY CHEST WITH CONTRAST TECHNIQUE: Multidetector CT imaging of the chest was performed using the standard protocol during bolus administration of intravenous contrast. Multiplanar CT image reconstructions and MIPs were obtained to evaluate the vascular anatomy. CONTRAST:  52mL OMNIPAQUE IOHEXOL 350 MG/ML SOLN COMPARISON:  None. FINDINGS: Cardiovascular: Adequate contrast opacification of the pulmonary arteries. No evidence of pulmonary embolus. No significant coronary artery calcifications or atherosclerotic disease of the thoracic aorta. Calcifications of the aortic valve. Normal heart size. No pericardial effusion. Mediastinum/Nodes: Esophagus is unremarkable. Left-sided thyroid nodule measuring 1.0 cm. Mildly enlarged mediastinal and hilar lymph nodes. Subcarinal lymph node measuring 1.4 cm in short axis on series 4, image 61, likely reactive. Lungs/Pleura: Central airways are patent. Bilateral patchy ground-glass opacities, right greater than left. No pleural effusion or pneumothorax. Upper Abdomen: No acute abnormality. Musculoskeletal: No chest wall abnormality. No acute or significant osseous findings. Review of the MIP images confirms the above findings. IMPRESSION: 1. No evidence of pulmonary embolus. 2. Bilateral patchy ground-glass opacities, right greater than left, concerning for multifocal infectious or inflammatory process. Asymmetric pulmonary edema is an additional  consideration. 3. Mildly enlarged mediastinal and hilar lymph nodes, likely reactive. 4. Calcifications of the aortic valve, which can be seen the setting of aortic stenosis or sclerosis. Consider further evaluation with echocardiography. 5. Left-sided thyroid nodule measuring 1.0 cm. Recommend further evaluation with nonemergent thyroid ultrasound. Electronically Signed   By: Yetta Glassman M.D.   On: 01/16/2021 11:59    Procedures Procedures  CRITICAL CARE Performed by: Charlesetta Shanks   Total critical care time: 45 minutes  Critical care time was exclusive of separately billable procedures and treating other patients.  Critical care was necessary to treat or prevent imminent or life-threatening deterioration.  Critical care was time spent personally by me on the following activities: development of treatment plan with patient and/or surrogate as well as nursing, discussions with consultants, evaluation of patient's response to treatment, examination of patient, obtaining history from patient or surrogate, ordering and performing treatments and interventions, ordering and review of laboratory studies, ordering and review of radiographic studies, pulse oximetry and re-evaluation of patient's condition.  Medications Ordered in ED Medications  lactated ringers infusion ( Intravenous New Bag/Given 01/16/21 1212)  vancomycin (VANCOCIN) IVPB 1000 mg/200 mL premix (has no administration in time range)  vancomycin (VANCOREADY) IVPB 500 mg/100 mL (has no administration in time range)  0.9 %  sodium chloride infusion (10 mLs Intravenous New Bag/Given 01/16/21 1025)  0.9 %  sodium chloride infusion (10 mLs Intravenous Incomplete 01/16/21 1120)  lactated ringers bolus 500 mL (has no administration in time range)  cefTRIAXone (ROCEPHIN) 2 g in sodium chloride 0.9 % 100 mL IVPB (0 g Intravenous Stopped 01/16/21 1130)  azithromycin (ZITHROMAX) 500 mg in sodium chloride 0.9 % 250 mL IVPB (500 mg  Intravenous New Bag/Given 01/16/21 1123)  ipratropium-albuterol (DUONEB) 0.5-2.5 (3) MG/3ML nebulizer solution 3 mL (3 mLs Nebulization Given 01/16/21 1021)  iohexol (OMNIPAQUE) 350 MG/ML injection 75 mL (75 mLs Intravenous Contrast Given 01/16/21 1138)    ED Course  I have reviewed the triage vital signs and the nursing notes.  Pertinent labs & imaging results that were available during my care of the patient were reviewed by me and considered in my medical decision making (see chart for details).  Clinical Course as of 01/16/21 1329  Thu Jan 16, 2021  1323 Consult: Reviewed Dr. Olevia Bowens [facility] hospitalist for admission.  Requests addition of procalcitonin.  [MP]    Clinical Course User Index [MP] Charlesetta Shanks, MD   MDM Rules/Calculators/A&P                           Patient presents after initial treatment 2 days ago for community-acquired pneumonia.  At that time patient was treated in the emergency department with Rocephin and Zithromax and subsequently discharged with continued Zithromax and Omnicef prescriptions.  Patient's condition has continued to worsen.  She has become more short of breath and cough is persisting and worsening.  She has having increased general weakness and fatigue.  On examination patient is hypoxic.  I remained in the room with her at bedside and with good waveform, oxygen saturation was from 87 to 92% with patient dyspneic in appearance.  At this time extensive diagnostic evaluation reinitiated.  Will review for worsening pneumonia, ACS, CHF, pulmonary embolus.  CT of the chest does not show any PE.  She does have extensive infiltrates bilaterally.  Patient does not have leukocytosis or significant lactic acidosis.  She is febrile with low-grade fever.  She meets SIRS criteria.  At this time we will proceed with sepsis protocol and continue IV antibiotics.  Troponin and BNP do not suggest ACS.  Patient's mental status is clear.  With new hypoxia and worsening  symptoms of bilateral pneumonia, will plan for admission. Final Clinical Impression(s) / ED Diagnoses Final diagnoses:  Hypoxia  Community acquired pneumonia of right lung, unspecified part of lung  SIRS (systemic inflammatory response syndrome) ([place])    Rx / DC Orders ED Discharge Orders     None        Charlesetta Shanks, MD  01/16/21 1332  

## 2021-01-17 ENCOUNTER — Inpatient Hospital Stay (HOSPITAL_COMMUNITY): Payer: Medicare Other

## 2021-01-17 DIAGNOSIS — J80 Acute respiratory distress syndrome: Secondary | ICD-10-CM | POA: Diagnosis not present

## 2021-01-17 DIAGNOSIS — Z20822 Contact with and (suspected) exposure to covid-19: Secondary | ICD-10-CM | POA: Diagnosis present

## 2021-01-17 DIAGNOSIS — E872 Acidosis, unspecified: Secondary | ICD-10-CM | POA: Diagnosis present

## 2021-01-17 DIAGNOSIS — I509 Heart failure, unspecified: Secondary | ICD-10-CM | POA: Diagnosis not present

## 2021-01-17 DIAGNOSIS — U099 Post covid-19 condition, unspecified: Secondary | ICD-10-CM | POA: Diagnosis present

## 2021-01-17 DIAGNOSIS — D84821 Immunodeficiency due to drugs: Secondary | ICD-10-CM | POA: Diagnosis present

## 2021-01-17 DIAGNOSIS — F05 Delirium due to known physiological condition: Secondary | ICD-10-CM | POA: Diagnosis not present

## 2021-01-17 DIAGNOSIS — Z79899 Other long term (current) drug therapy: Secondary | ICD-10-CM | POA: Diagnosis not present

## 2021-01-17 DIAGNOSIS — H409 Unspecified glaucoma: Secondary | ICD-10-CM | POA: Diagnosis present

## 2021-01-17 DIAGNOSIS — J9600 Acute respiratory failure, unspecified whether with hypoxia or hypercapnia: Secondary | ICD-10-CM | POA: Diagnosis not present

## 2021-01-17 DIAGNOSIS — U071 COVID-19: Secondary | ICD-10-CM | POA: Diagnosis not present

## 2021-01-17 DIAGNOSIS — R609 Edema, unspecified: Secondary | ICD-10-CM | POA: Diagnosis not present

## 2021-01-17 DIAGNOSIS — R0902 Hypoxemia: Secondary | ICD-10-CM | POA: Diagnosis present

## 2021-01-17 DIAGNOSIS — I350 Nonrheumatic aortic (valve) stenosis: Secondary | ICD-10-CM | POA: Diagnosis present

## 2021-01-17 DIAGNOSIS — J9601 Acute respiratory failure with hypoxia: Secondary | ICD-10-CM | POA: Diagnosis not present

## 2021-01-17 DIAGNOSIS — E119 Type 2 diabetes mellitus without complications: Secondary | ICD-10-CM | POA: Diagnosis present

## 2021-01-17 DIAGNOSIS — J189 Pneumonia, unspecified organism: Secondary | ICD-10-CM | POA: Diagnosis not present

## 2021-01-17 DIAGNOSIS — Z8249 Family history of ischemic heart disease and other diseases of the circulatory system: Secondary | ICD-10-CM | POA: Diagnosis not present

## 2021-01-17 DIAGNOSIS — J8489 Other specified interstitial pulmonary diseases: Secondary | ICD-10-CM | POA: Diagnosis present

## 2021-01-17 DIAGNOSIS — I82452 Acute embolism and thrombosis of left peroneal vein: Secondary | ICD-10-CM | POA: Diagnosis not present

## 2021-01-17 DIAGNOSIS — I82442 Acute embolism and thrombosis of left tibial vein: Secondary | ICD-10-CM | POA: Diagnosis not present

## 2021-01-17 DIAGNOSIS — Z803 Family history of malignant neoplasm of breast: Secondary | ICD-10-CM | POA: Diagnosis not present

## 2021-01-17 DIAGNOSIS — T451X5A Adverse effect of antineoplastic and immunosuppressive drugs, initial encounter: Secondary | ICD-10-CM | POA: Diagnosis present

## 2021-01-17 DIAGNOSIS — J9621 Acute and chronic respiratory failure with hypoxia: Secondary | ICD-10-CM | POA: Diagnosis not present

## 2021-01-17 DIAGNOSIS — E785 Hyperlipidemia, unspecified: Secondary | ICD-10-CM | POA: Diagnosis present

## 2021-01-17 DIAGNOSIS — H209 Unspecified iridocyclitis: Secondary | ICD-10-CM | POA: Diagnosis present

## 2021-01-17 DIAGNOSIS — I959 Hypotension, unspecified: Secondary | ICD-10-CM | POA: Diagnosis present

## 2021-01-17 DIAGNOSIS — Z823 Family history of stroke: Secondary | ICD-10-CM | POA: Diagnosis not present

## 2021-01-17 DIAGNOSIS — T394X5A Adverse effect of antirheumatics, not elsewhere classified, initial encounter: Secondary | ICD-10-CM | POA: Diagnosis present

## 2021-01-17 DIAGNOSIS — I82432 Acute embolism and thrombosis of left popliteal vein: Secondary | ICD-10-CM | POA: Diagnosis not present

## 2021-01-17 DIAGNOSIS — R6510 Systemic inflammatory response syndrome (SIRS) of non-infectious origin without acute organ dysfunction: Secondary | ICD-10-CM | POA: Diagnosis not present

## 2021-01-17 DIAGNOSIS — E041 Nontoxic single thyroid nodule: Secondary | ICD-10-CM | POA: Diagnosis present

## 2021-01-17 DIAGNOSIS — M1990 Unspecified osteoarthritis, unspecified site: Secondary | ICD-10-CM | POA: Diagnosis present

## 2021-01-17 LAB — GLUCOSE, CAPILLARY
Glucose-Capillary: 110 mg/dL — ABNORMAL HIGH (ref 70–99)
Glucose-Capillary: 105 mg/dL — ABNORMAL HIGH (ref 70–99)
Glucose-Capillary: 116 mg/dL — ABNORMAL HIGH (ref 70–99)
Glucose-Capillary: 138 mg/dL — ABNORMAL HIGH (ref 70–99)

## 2021-01-17 LAB — PROCALCITONIN: Procalcitonin: <0.1 ng/mL

## 2021-01-17 LAB — CBC
HCT: 35.5 % — ABNORMAL LOW (ref 36.0–46.0)
Hemoglobin: 11.6 g/dL — ABNORMAL LOW (ref 12.0–15.0)
MCH: 31.4 pg (ref 26.0–34.0)
MCHC: 32.7 g/dL (ref 30.0–36.0)
MCV: 96.2 fL (ref 80.0–100.0)
Platelets: 184 10*3/uL (ref 150–400)
RBC: 3.69 MIL/uL — ABNORMAL LOW (ref 3.87–5.11)
RDW: 13.6 % (ref 11.5–15.5)
WBC: 4.5 10*3/uL (ref 4.0–10.5)
nRBC: 0 % (ref 0.0–0.2)

## 2021-01-17 LAB — COMPREHENSIVE METABOLIC PANEL
ALT: 18 U/L (ref 0–44)
AST: 33 U/L (ref 15–41)
Albumin: 2.7 g/dL — ABNORMAL LOW (ref 3.5–5.0)
Alkaline Phosphatase: 43 U/L (ref 38–126)
Anion gap: 7 (ref 5–15)
BUN: 9 mg/dL (ref 8–23)
CO2: 23 mmol/L (ref 22–32)
Calcium: 8.4 mg/dL — ABNORMAL LOW (ref 8.9–10.3)
Chloride: 109 mmol/L (ref 98–111)
Creatinine, Ser: 0.42 mg/dL — ABNORMAL LOW (ref 0.44–1.00)
GFR, Estimated: >60 mL/min (ref >60)
Glucose, Bld: 136 mg/dL — ABNORMAL HIGH (ref 70–99)
Potassium: 3.7 mmol/L (ref 3.5–5.1)
Sodium: 139 mmol/L (ref 135–145)
Total Bilirubin: 0.7 mg/dL (ref 0.3–1.2)
Total Protein: 5.3 g/dL — ABNORMAL LOW (ref 6.5–8.1)

## 2021-01-17 LAB — MRSA NEXT GEN BY PCR, NASAL: MRSA by PCR Next Gen: NOT DETECTED

## 2021-01-17 LAB — HIV ANTIBODY (ROUTINE TESTING W REFLEX): HIV Screen 4th Generation wRfx: NONREACTIVE

## 2021-01-17 MED ORDER — IPRATROPIUM-ALBUTEROL 0.5-2.5 (3) MG/3ML IN SOLN
3.0000 mL | Freq: Two times a day (BID) | RESPIRATORY_TRACT | Status: DC
  Administered 2021-01-18 – 2021-01-20 (×5): 3 mL via RESPIRATORY_TRACT
  Filled 2021-01-17 (×5): qty 3

## 2021-01-17 MED ORDER — IPRATROPIUM-ALBUTEROL 0.5-2.5 (3) MG/3ML IN SOLN
3.0000 mL | Freq: Four times a day (QID) | RESPIRATORY_TRACT | Status: DC
  Administered 2021-01-17: 3 mL via RESPIRATORY_TRACT

## 2021-01-17 NOTE — Progress Notes (Signed)
SATURATION QUALIFICATIONS: (This note is used to comply with regulatory documentation for home oxygen)  Patient Saturations on Room Air at Rest = 83%  Patient Saturations on Room Air while Ambulating = 70%  Patient Saturations on 3 Liters of oxygen while Ambulating = 85%   Please briefly explain why patient needs home oxygen: Unable to pick-up sat on finger due to gel nail polish, checked saturation over forehead Patient C/O SOB with activity short distance in the room and oxygen saturation in the low 70s to 80s, back in the bed sat still in the 80s on 2L, increased oxygen to 4L up to 93%, encouraged patient to keep using incentive spirometry.

## 2021-01-17 NOTE — Progress Notes (Signed)
PROGRESS NOTE    [name]  ZOX:[id number] DOB: 1950/11/12 DOA: 01/16/2021 PCP: Laurann Montana, MD   Chief Complaint  Patient presents with   Shortness of Breath    Brief Narrative:   Christina Higgins is a 70 y.o. 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 Pt reports of having COVID Infection in October first week. Following the infection, pt reports persistent symptoms of cold, congestion, sob and cough, she was seen by PCP and given prescription for Antibiotics for 5 days. She complete the course and continues to remain symptomatic.she was seen again and was given another course of antibiotic, without any relief . She underwent CXR showing pneumonia, sent to ED for further evaluation.   Assessment & Plan:   Principal Problem:   Bilateral pneumonia Active Problems:   Type 2 diabetes mellitus without complication (HCC)   Acute respiratory failure with hypoxia secondary to bilateral pneumonia/SIRS. Started her on broad spectrum IV antibiotics,  Currently on 2 lit of [place] oxygen.  Blood cultures have been negative so far.    Hyperlipidemia:  Resume statin.    Diet controlled DM:  CBG (last 3)  Recent Labs    01/17/21 0732 01/17/21 1154 01/17/21 1620  GLUCAP 110* 105* 116*   Resume SSI.    Thyroid nodule:  US thyroid ordered.    Lactic acidosis:  Resolved.    DVT prophylaxis: (Lovenox/) Code Status: (Full code) Family Communication: family at bedside.  Disposition:   Status is: Inpatient  Remains inpatient appropriate because: IV antibiotics.        Consultants:  None.   Procedures: none.   Antimicrobials: ( Antibiotics Given (last 72 hours)     Date/Time Action Medication Dose Rate   01/16/21 1026 New Bag/Given   cefTRIAXone (ROCEPHIN) 2 g in sodium chloride 0.9 % 100 mL IVPB 2 g 200 mL/hr   01/16/21 1123 New Bag/Given    azithromycin (ZITHROMAX) 500 mg in sodium chloride 0.9 % 250 mL IVPB 500 mg 250 mL/hr   01/16/21 1810 New Bag/Given   ceFEPIme (MAXIPIME) 2 g in sodium chloride 0.9 % 100 mL IVPB 2 g 200 mL/hr   01/16/21 2253 Given   acyclovir (ZOVIRAX) tablet 800 mg 800 mg    01/17/21 0224 New Bag/Given   ceFEPIme (MAXIPIME) 2 g in sodium chloride 0.9 % 100 mL IVPB 2 g 200 mL/hr   01/17/21 0650 New Bag/Given   vancomycin (VANCOREADY) IVPB 500 mg/100 mL 500 mg 100 mL/hr   01/17/21 8119 Given   acyclovir (ZOVIRAX) tablet 800 mg 800 mg    01/17/21 1011 New Bag/Given   ceFEPIme (MAXIPIME) 2 g in sodium chloride 0.9 % 100 mL IVPB 2 g 200 mL/hr   01/17/21 1639 New Bag/Given   vancomycin (VANCOREADY) IVPB 500 mg/100 mL 500 mg 100 mL/hr         Subjective: Reports feeling the same.   Objective: Vitals:   01/16/21 2051 01/17/21 0022 01/17/21 0530 01/17/21 1204  BP: (!) 100/52 (!) 101/55 (!) 98/55 101/66  Pulse: 70 80 78 78  Resp: 20 20 19 18   Temp: 98.3 F (36.8 C) 97.9 F (36.6 C) 98.8 F (37.1 C) 98.6 F (37 C)  TempSrc: Oral Oral Oral Oral  SpO2:  (!) 86% 92% 90%  Weight:      Height:        Intake/Output Summary (Last 24 hours) at 01/17/2021  1706 Last data filed at 01/17/2021 0800 Gross per 24 hour  Intake 1800.64 ml  Output 600 ml  Net 1200.64 ml   Filed Weights   01/16/21 0911 01/16/21 1701  Weight: 62.6 kg 63.2 kg    Examination:  General exam: Appears calm and comfortable  Respiratory system: Clear to auscultation. Respiratory effort normal. Cardiovascular system: S1 & S2 heard, RRR. No JVD,  No pedal edema. Gastrointestinal system: Abdomen is nondistended, soft and nontender. Normal bowel sounds heard. Central nervous system: Alert and oriented. No focal neurological deficits. Extremities: Symmetric 5 x 5 power. Skin: No rashes, lesions or ulcers Psychiatry: Judgement and insight appear normal. Mood & affect appropriate.     Data Reviewed: I have personally reviewed  following labs and imaging studies  CBC: Recent Labs  Lab 01/14/21 2049 01/16/21 0917 01/17/21 0341  WBC 4.7 4.1 4.5  NEUTROABS  --  3.3  --   HGB 13.0 12.4 11.6*  HCT 40.1 37.8 35.5*  MCV 95.5 95.9 96.2  PLT 217 216 184    Basic Metabolic Panel: Recent Labs  Lab 01/14/21 2049 01/16/21 0917 01/16/21 1342 01/17/21 0341  NA 139 139  --  139  K 4.2 3.8  --  3.7  CL 102 105  --  109  CO2 28 23  --  23  GLUCOSE 137* 177*  --  136*  BUN 17 13  --  9  CREATININE 0.71 0.64  --  0.42*  CALCIUM 9.5 9.4  --  8.4*  MG  --   --  1.9  --   PHOS  --   --  2.7  --     GFR: Estimated Creatinine Clearance: 55.1 mL/min (A) (by C-G formula based on SCr of 0.42 mg/dL (L)).  Liver Function Tests: Recent Labs  Lab 01/14/21 2049 01/16/21 0917 01/17/21 0341  AST 31 34 33  ALT ALKPHOS 50 43 43  BILITOT 0.7 0.8 0.7  PROT 6.5 6.3* 5.3*  ALBUMIN 3.8 3.7 2.7*    CBG: Recent Labs  Lab 01/16/21 1603 01/16/21 2052 01/17/21 0732 01/17/21 1154 01/17/21 1620  GLUCAP 140* 148* 110* 105* 116*     Recent Results (from the past 240 hour(s))  Resp Panel by RT-PCR (Flu A&B, Covid) Nasopharyngeal Swab     Status: None   Collection Time: 01/14/21  7:43 PM   Specimen: Nasopharyngeal Swab; Nasopharyngeal(NP) swabs in vial transport medium  Result Value Ref Range Status   SARS Coronavirus 2 by RT PCR NEGATIVE NEGATIVE Final    Comment: (NOTE) SARS-CoV-2 target nucleic acids are NOT DETECTED.  The SARS-CoV-2 RNA is generally detectable in upper respiratory specimens during the acute phase of infection. The lowest concentration of SARS-CoV-2 viral copies this assay can detect is 138 copies/mL. A negative result does not preclude SARS-Cov-2 infection and should not be used as the sole basis for treatment or other patient management decisions. A negative result may occur with  improper specimen collection/handling, submission of specimen other than nasopharyngeal swab, presence  of viral mutation(s) within the areas targeted by this assay, and inadequate number of viral copies(<138 copies/mL). A negative result must be combined with clinical observations, patient history, and epidemiological information. The expected result is Negative.  Fact Sheet for Patients:  BloggerCourse.com  Fact Sheet for Healthcare Providers:  SeriousBroker.it  This test is no t yet approved or cleared by the [place] FDA and  has been authorized for detection and/or diagnosis of SARS-CoV-2 by  FDA under an Emergency Use Authorization (EUA). This EUA will remain  in effect (meaning this test can be used) for the duration of the COVID-19 declaration under Section 564(b)(1) of the Act, 21 U.S.C.section 360bbb-3(b)(1), unless the authorization is terminated  or revoked sooner.       Influenza A by PCR NEGATIVE NEGATIVE Final   Influenza B by PCR NEGATIVE NEGATIVE Final    Comment: (NOTE) The Xpert Xpress SARS-CoV-2/FLU/RSV plus assay is intended as an aid in the diagnosis of influenza from Nasopharyngeal swab specimens and should not be used as a sole basis for treatment. Nasal washings and aspirates are unacceptable for Xpert Xpress SARS-CoV-2/FLU/RSV testing.  Fact Sheet for Patients: BloggerCourse.com  Fact Sheet for Healthcare Providers: SeriousBroker.it  This test is not yet approved or cleared by the [place] FDA and has been authorized for detection and/or diagnosis of SARS-CoV-2 by FDA under an Emergency Use Authorization (EUA). This EUA will remain in effect (meaning this test can be used) for the duration of the COVID-19 declaration under Section 564(b)(1) of the Act, 21 U.S.C. section 360bbb-3(b)(1), unless the authorization is terminated or revoked.  Performed at [facility], [address], [place], [place] [address]   Blood culture  (routine x 2)     Status: None (Preliminary result)   Collection Time: 01/14/21  8:35 PM   Specimen: BLOOD  Result Value Ref Range Status   Specimen Description   Final    BLOOD RIGHT ANTECUBITAL Performed at [facility], [address] [place], [place] [address]    Special Requests   Final    BOTTLES DRAWN AEROBIC AND ANAEROBIC Blood Culture results may not be optimal due to an excessive volume of blood received in culture bottles Performed at [facility], [address], [place], [place] [address]    Culture   Final    NO GROWTH 3 DAYS Performed at [facility], [address] [place], [place] [address]    Report Status PENDING  Incomplete  Blood culture (routine x 2)     Status: None (Preliminary result)   Collection Time: 01/14/21  8:45 PM   Specimen: BLOOD  Result Value Ref Range Status   Specimen Description   Final    BLOOD LEFT ANTECUBITAL Performed at [facility], [address] [place], [place] [address]    Special Requests   Final    BOTTLES DRAWN AEROBIC AND ANAEROBIC Blood Culture results may not be optimal due to an excessive volume of blood received in culture bottles Performed at [facility], [address] [place], [place] [address]    Culture   Final    NO GROWTH 3 DAYS Performed at [facility], [address], [place], [place] [address]    Report Status PENDING  Incomplete  Culture, blood (single)     Status: None (Preliminary result)   Collection Time: 01/16/21  9:17 AM   Specimen: Right Antecubital; Blood  Result Value Ref Range Status   Specimen Description   Final    RIGHT ANTECUBITAL Performed at [facility], [address], [place], [place] [address]    Special Requests   Final    Blood Culture adequate volume Performed at [facility], [address] [place], [place] [address]    Culture   Final    NO  GROWTH 1 DAY Performed at [facility], [address], [place], [place] [address]  Report Status PENDING  Incomplete  Resp Panel by RT-PCR (Flu A&B, Covid) Nasopharyngeal Swab     Status: None   Collection Time: 01/16/21 10:26 AM   Specimen: Nasopharyngeal Swab; Nasopharyngeal(NP) swabs in vial transport medium  Result Value Ref Range Status   SARS Coronavirus 2 by RT PCR NEGATIVE NEGATIVE Final    Comment: (NOTE) SARS-CoV-2 target nucleic acids are NOT DETECTED.  The SARS-CoV-2 RNA is generally detectable in upper respiratory specimens during the acute phase of infection. The lowest concentration of SARS-CoV-2 viral copies this assay can detect is 138 copies/mL. A negative result does not preclude SARS-Cov-2 infection and should not be used as the sole basis for treatment or other patient management decisions. A negative result may occur with  improper specimen collection/handling, submission of specimen other than nasopharyngeal swab, presence of viral mutation(s) within the areas targeted by this assay, and inadequate number of viral copies(<138 copies/mL). A negative result must be combined with clinical observations, patient history, and epidemiological information. The expected result is Negative.  Fact Sheet for Patients:  BloggerCourse.com  Fact Sheet for Healthcare Providers:  SeriousBroker.it  This test is no t yet approved or cleared by the [place] FDA and  has been authorized for detection and/or diagnosis of SARS-CoV-2 by FDA under an Emergency Use Authorization (EUA). This EUA will remain  in effect (meaning this test can be used) for the duration of the COVID-19 declaration under Section 564(b)(1) of the Act, 21 U.S.C.section 360bbb-3(b)(1), unless the authorization is terminated  or revoked sooner.       Influenza A by PCR NEGATIVE NEGATIVE Final   Influenza B by PCR NEGATIVE NEGATIVE Final     Comment: (NOTE) The Xpert Xpress SARS-CoV-2/FLU/RSV plus assay is intended as an aid in the diagnosis of influenza from Nasopharyngeal swab specimens and should not be used as a sole basis for treatment. Nasal washings and aspirates are unacceptable for Xpert Xpress SARS-CoV-2/FLU/RSV testing.  Fact Sheet for Patients: BloggerCourse.com  Fact Sheet for Healthcare Providers: SeriousBroker.it  This test is not yet approved or cleared by the [place] FDA and has been authorized for detection and/or diagnosis of SARS-CoV-2 by FDA under an Emergency Use Authorization (EUA). This EUA will remain in effect (meaning this test can be used) for the duration of the COVID-19 declaration under Section 564(b)(1) of the Act, 21 U.S.C. section 360bbb-3(b)(1), unless the authorization is terminated or revoked.  Performed at [facility], [address], [place], [place] [address]          Radiology Studies: DG Chest 2 View  Result Date: 01/16/2021 CLINICAL DATA:  Shortness of breath, cough EXAM: CHEST - 2 VIEW COMPARISON:  Chest radiograph 01/14/2021 FINDINGS: The cardiomediastinal silhouette is stable. Again seen are patchy opacities in the lungs, significantly worse on the right, not significantly changed since 01/14/2021. There is no pleural effusion. There is no pneumothorax. There is no acute osseous abnormality. IMPRESSION: The opacities in the lungs, significantly worse on the right, are not significantly changed since 01/14/2021, and remain concerning for pneumonia. Recommend follow-up radiographs in 6-8 weeks to document resolution. Electronically Signed   By: Lesia Hausen M.D.   On: 01/16/2021 09:36   CT Angio Chest PE W/Cm &/Or Wo Cm  Result Date: 01/16/2021 CLINICAL DATA:  Shortness of breath EXAM: CT ANGIOGRAPHY CHEST WITH CONTRAST TECHNIQUE: Multidetector CT imaging of the chest was performed using the  standard protocol during bolus administration of intravenous contrast. Multiplanar CT image reconstructions and MIPs  were obtained to evaluate the vascular anatomy. CONTRAST:  69mL OMNIPAQUE IOHEXOL 350 MG/ML SOLN COMPARISON:  None. FINDINGS: Cardiovascular: Adequate contrast opacification of the pulmonary arteries. No evidence of pulmonary embolus. No significant coronary artery calcifications or atherosclerotic disease of the thoracic aorta. Calcifications of the aortic valve. Normal heart size. No pericardial effusion. Mediastinum/Nodes: Esophagus is unremarkable. Left-sided thyroid nodule measuring 1.0 cm. Mildly enlarged mediastinal and hilar lymph nodes. Subcarinal lymph node measuring 1.4 cm in short axis on series 4, image 61, likely reactive. Lungs/Pleura: Central airways are patent. Bilateral patchy ground-glass opacities, right greater than left. No pleural effusion or pneumothorax. Upper Abdomen: No acute abnormality. Musculoskeletal: No chest wall abnormality. No acute or significant osseous findings. Review of the MIP images confirms the above findings. IMPRESSION: 1. No evidence of pulmonary embolus. 2. Bilateral patchy ground-glass opacities, right greater than left, concerning for multifocal infectious or inflammatory process. Asymmetric pulmonary edema is an additional consideration. 3. Mildly enlarged mediastinal and hilar lymph nodes, likely reactive. 4. Calcifications of the aortic valve, which can be seen the setting of aortic stenosis or sclerosis. Consider further evaluation with echocardiography. 5. Left-sided thyroid nodule measuring 1.0 cm. Recommend further evaluation with nonemergent thyroid ultrasound. Electronically Signed   By: Allegra Lai M.D.   On: 01/16/2021 11:59        Scheduled Meds:  acyclovir  800 mg Oral Daily   atorvastatin  40 mg Oral Daily   azaTHIOprine  100 mg Oral Daily   enoxaparin (LOVENOX) injection  40 mg Subcutaneous Q24H   FLUoxetine  10 mg Oral q  morning   fluticasone  1 spray Each Nare Daily   insulin aspart  0-9 Units Subcutaneous TID WC   [START ON 01/18/2021] ipratropium-albuterol  3 mL Nebulization BID   latanoprost  1 drop Both Eyes QHS   multivitamin with minerals  1 tablet Oral Daily   omega-3 acid ethyl esters  1 g Oral Daily   timolol  1 drop Left Eye BID   Continuous Infusions:  sodium chloride Stopped (01/16/21 1522)   sodium chloride Stopped (01/16/21 1522)   ceFEPime (MAXIPIME) IV 2 g (01/17/21 1011)   vancomycin 500 mg (01/17/21 1639)     LOS: 0 days        Kathlen Mody, MD [facility]   To contact the attending provider between 7A-7P or the covering provider during after hours 7P-7A, please log into the web site [email or web address] and access using universal [place] password for that web site. If you do not have the password, please call the hospital operator.  01/17/2021, 5:06 PM

## 2021-01-18 ENCOUNTER — Inpatient Hospital Stay (HOSPITAL_COMMUNITY): Payer: Medicare Other

## 2021-01-18 DIAGNOSIS — J189 Pneumonia, unspecified organism: Secondary | ICD-10-CM | POA: Diagnosis not present

## 2021-01-18 DIAGNOSIS — R0902 Hypoxemia: Secondary | ICD-10-CM | POA: Diagnosis not present

## 2021-01-18 DIAGNOSIS — J9601 Acute respiratory failure with hypoxia: Secondary | ICD-10-CM | POA: Diagnosis not present

## 2021-01-18 LAB — BLOOD GAS, ARTERIAL
Acid-base deficit: 2.3 mmol/L — ABNORMAL HIGH (ref 0.0–2.0)
Bicarbonate: 20.9 mmol/L (ref 20.0–28.0)
O2 Saturation: 89.9 %
Patient temperature: 98.6
pCO2 arterial: 31.8 mmHg — ABNORMAL LOW (ref 32.0–48.0)
pH, Arterial: 7.433 (ref 7.350–7.450)
pO2, Arterial: 59 mmHg — ABNORMAL LOW (ref 83.0–108.0)

## 2021-01-18 LAB — PROCALCITONIN: Procalcitonin: 0.11 ng/mL

## 2021-01-18 LAB — GLUCOSE, CAPILLARY
Glucose-Capillary: 116 mg/dL — ABNORMAL HIGH (ref 70–99)
Glucose-Capillary: 148 mg/dL — ABNORMAL HIGH (ref 70–99)
Glucose-Capillary: 110 mg/dL — ABNORMAL HIGH (ref 70–99)
Glucose-Capillary: 166 mg/dL — ABNORMAL HIGH (ref 70–99)

## 2021-01-18 MED ORDER — SODIUM CHLORIDE 3 % IN NEBU
4.0000 mL | INHALATION_SOLUTION | Freq: Once | RESPIRATORY_TRACT | Status: AC
  Administered 2021-01-18: 4 mL via RESPIRATORY_TRACT
  Filled 2021-01-18: qty 4

## 2021-01-18 MED ORDER — METHYLPREDNISOLONE SODIUM SUCC 125 MG IJ SOLR
125.0000 mg | Freq: Three times a day (TID) | INTRAMUSCULAR | Status: DC
Start: 2021-01-18 — End: 2021-01-21
  Administered 2021-01-18 – 2021-01-21 (×9): 125 mg via INTRAVENOUS
  Filled 2021-01-18 (×9): qty 2

## 2021-01-18 MED ORDER — CHLORHEXIDINE GLUCONATE CLOTH 2 % EX PADS
6.0000 | MEDICATED_PAD | Freq: Every day | CUTANEOUS | Status: DC
  Administered 2021-01-18 – 2021-01-28 (×10): 6 via TOPICAL

## 2021-01-18 MED ORDER — SULFAMETHOXAZOLE-TRIMETHOPRIM 400-80 MG PO TABS
1.0000 | ORAL_TABLET | Freq: Once | ORAL | Status: AC
  Administered 2021-01-18: 1 via ORAL
  Filled 2021-01-18: qty 1

## 2021-01-18 NOTE — Progress Notes (Signed)
0945 this shift the pt was assisted to the [facility] by the nurse tech without the use of O2. The pt desaturated to the 50's and had a witnessed fall. With the NT's assistance she was lowered to the floor suffering no injury. Rapid response was called and the pt was placed on a NRB. O2 came back up in to the mid 90's and NRB was replaced by HFNC at 10L O2. VS remained stable throughout and the pt is no longer in any acute distress. MD made aware, orders placed for CXR and ABG. Will continue to monitor.

## 2021-01-18 NOTE — Progress Notes (Signed)
Sputum induction completed. Patient produced 2 samples (pink tinged, tan) in which were collected in 2 separate sterile cups, labeled and sent to lab.

## 2021-01-18 NOTE — Progress Notes (Signed)
PROGRESS NOTE    Christina Higgins  NWG:[id number] DOB: 01/13/1951 DOA: 01/16/2021 PCP: Laurann Montana, MD   Chief Complaint  Patient presents with   Shortness of Breath    Brief Narrative:   Christina Higgins is a 70 y.o. 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 Pt reports of having COVID Infection in October first week. Following the infection, pt reports persistent symptoms of cold, congestion, sob and cough, she was seen by PCP and given prescription for Antibiotics for 5 days. She complete the course and continues to remain symptomatic.she was seen again and was given another course of antibiotic, without any relief . She underwent CXR showing pneumonia, sent to ED for further evaluation.   Assessment & Plan:   Principal Problem:   Bilateral pneumonia Active Problems:   Type 2 diabetes mellitus without complication (HCC)   Acute respiratory failure with hypoxia secondary to bilateral pneumonia/SIRS. Immunocompromised as she is on azathioprine and Humira injections for Uveitis.  Started her on broad spectrum IV antibiotics, IV Vancomycin and IV cefepime, improving pro calcitonin.  Blood cultures have been negative so far.  Urine for strep and legionella pending. He BNP is 43 and she does not appear to be fluid overloaded.   Pt earlier had a respiratory event, where she became severe hypoxic and had to be put on NRB followed by high flow oxygen to maintain sats greater than 90%.  ABG showed hypoxia with Po2 of 59 and pH of 7.4 and pco2 of 31. CXR repeated, resp panel by PCR ordered.  Differential include sequela from COVID infection from last month.  In view of her increased oxygen requirement, PCCm consulted for recommendations.         Hyperlipidemia:  Resume statin.    Diet controlled DM:  CBG (last 3)  Recent Labs    01/17/21 2129 01/18/21 0854  01/18/21 1240  GLUCAP 138* 116* 148*    Resume SSI. Hemoglobin A1c is 6.1.   Thyroid nodule:  US thyroid ordered. Will need outpatient follow up in one year.    Lactic acidosis:  Resolved.    DVT prophylaxis: (Lovenox/) Code Status: (Full code) Family Communication: family at bedside.  Disposition:   Status is: Inpatient  Remains inpatient appropriate because: IV antibiotics.        Consultants:  PCCM.   Procedures: none.   Antimicrobials: ( Antibiotics Given (last 72 hours)     Date/Time Action Medication Dose Rate   01/16/21 1026 New Bag/Given   cefTRIAXone (ROCEPHIN) 2 g in sodium chloride 0.9 % 100 mL IVPB 2 g 200 mL/hr   01/16/21 1123 New Bag/Given   azithromycin (ZITHROMAX) 500 mg in sodium chloride 0.9 % 250 mL IVPB 500 mg 250 mL/hr   01/16/21 1810 New Bag/Given   ceFEPIme (MAXIPIME) 2 g in sodium chloride 0.9 % 100 mL IVPB 2 g 200 mL/hr   01/16/21 2253 Given   acyclovir (ZOVIRAX) tablet 800 mg 800 mg    01/17/21 0224 New Bag/Given   ceFEPIme (MAXIPIME) 2 g in sodium chloride 0.9 % 100 mL IVPB 2 g 200 mL/hr   01/17/21 0650 New Bag/Given   vancomycin (VANCOREADY) IVPB 500 mg/100 mL 500 mg 100 mL/hr   01/17/21 0917 Given   acyclovir (ZOVIRAX) tablet 800 mg 800 mg    01/17/21 1011 New Bag/Given   ceFEPIme (MAXIPIME) 2 g in sodium chloride 0.9 % 100  mL IVPB 2 g 200 mL/hr   01/17/21 1639 New Bag/Given   vancomycin (VANCOREADY) IVPB 500 mg/100 mL 500 mg 100 mL/hr   01/17/21 1842 New Bag/Given   ceFEPIme (MAXIPIME) 2 g in sodium chloride 0.9 % 100 mL IVPB 2 g 200 mL/hr   01/18/21 0342 New Bag/Given   ceFEPIme (MAXIPIME) 2 g in sodium chloride 0.9 % 100 mL IVPB 2 g 200 mL/hr   01/18/21 0547 New Bag/Given   vancomycin (VANCOREADY) IVPB 500 mg/100 mL 500 mg 100 mL/hr   01/18/21 1118 Given   acyclovir (ZOVIRAX) tablet 800 mg 800 mg    01/18/21 1131 New Bag/Given   ceFEPIme (MAXIPIME) 2 g in sodium chloride 0.9 % 100 mL IVPB 2 g 200 mL/hr          Subjective: Sob, dizzy on 10 lit of HF oxygen.   Objective: Vitals:   01/18/21 0923 01/18/21 1058 01/18/21 1106 01/18/21 1144  BP:  (!) 97/52    Pulse:  87 86   Resp:  18 18   Temp:  98.4 F (36.9 C)    TempSrc:  Oral    SpO2: 94%  96% 92%  Weight:      Height:        Intake/Output Summary (Last 24 hours) at 01/18/2021 1454 Last data filed at 01/18/2021 0600 Gross per 24 hour  Intake 503.5 ml  Output --  Net 503.5 ml    Filed Weights   01/16/21 0911 01/16/21 1701  Weight: 62.6 kg 63.2 kg    Examination:  General exam: Appears calm and comfortable  Respiratory system: diminished air entry at bases, no wheezing heard,  Cardiovascular system: S1 & S2 heard, RRR. No JVD,  No pedal edema. Gastrointestinal system: Abdomen is nondistended, soft and nontender. Normal bowel sounds heard. Central nervous system: Alert and oriented. No focal neurological deficits. Extremities: Symmetric 5 x 5 power. Skin: No rashes, lesions or ulcers Psychiatry:  Mood & affect appropriate.      Data Reviewed: I have personally reviewed following labs and imaging studies  CBC: Recent Labs  Lab 01/14/21 2049 01/16/21 0917 01/17/21 0341  WBC 4.7 4.1 4.5  NEUTROABS  --  3.3  --   HGB 13.0 12.4 11.6*  HCT 40.1 37.8 35.5*  MCV 95.5 95.9 96.2  PLT 217 216 184     Basic Metabolic Panel: Recent Labs  Lab 01/14/21 2049 01/16/21 0917 01/16/21 1342 01/17/21 0341  NA 139 139  --  139  K 4.2 3.8  --  3.7  CL 102 105  --  109  CO2 28 23  --  23  GLUCOSE 137* 177*  --  136*  BUN 17 13  --  9  CREATININE 0.71 0.64  --  0.42*  CALCIUM 9.5 9.4  --  8.4*  MG  --   --  1.9  --   PHOS  --   --  2.7  --      GFR: Estimated Creatinine Clearance: 55.1 mL/min (A) (by C-G formula based on SCr of 0.42 mg/dL (L)).  Liver Function Tests: Recent Labs  Lab 01/14/21 2049 01/16/21 0917 01/17/21 0341  AST 31 34 33  ALT 22 16 18   ALKPHOS 50 43 43  BILITOT 0.7 0.8 0.7  PROT 6.5  6.3* 5.3*  ALBUMIN 3.8 3.7 2.7*     CBG: Recent Labs  Lab 01/17/21 1154 01/17/21 1620 01/17/21 2129 01/18/21 0854 01/18/21 1240  GLUCAP 105* 116* 138* 116* 148*  Recent Results (from the past 240 hour(s))  Resp Panel by RT-PCR (Flu A&B, Covid) Nasopharyngeal Swab     Status: None   Collection Time: 01/14/21  7:43 PM   Specimen: Nasopharyngeal Swab; Nasopharyngeal(NP) swabs in vial transport medium  Result Value Ref Range Status   SARS Coronavirus 2 by RT PCR NEGATIVE NEGATIVE Final    Comment: (NOTE) SARS-CoV-2 target nucleic acids are NOT DETECTED.  The SARS-CoV-2 RNA is generally detectable in upper respiratory specimens during the acute phase of infection. The lowest concentration of SARS-CoV-2 viral copies this assay can detect is 138 copies/mL. A negative result does not preclude SARS-Cov-2 infection and should not be used as the sole basis for treatment or other patient management decisions. A negative result may occur with  improper specimen collection/handling, submission of specimen other than nasopharyngeal swab, presence of viral mutation(s) within the areas targeted by this assay, and inadequate number of viral copies(<138 copies/mL). A negative result must be combined with clinical observations, patient history, and epidemiological information. The expected result is Negative.  Fact Sheet for Patients:  BloggerCourse.com  Fact Sheet for Healthcare Providers:  SeriousBroker.it  This test is no t yet approved or cleared by the [place] FDA and  has been authorized for detection and/or diagnosis of SARS-CoV-2 by FDA under an Emergency Use Authorization (EUA). This EUA will remain  in effect (meaning this test can be used) for the duration of the COVID-19 declaration under Section 564(b)(1) of the Act, 21 U.S.C.section 360bbb-3(b)(1), unless the authorization is terminated  or revoked sooner.        Influenza A by PCR NEGATIVE NEGATIVE Final   Influenza B by PCR NEGATIVE NEGATIVE Final    Comment: (NOTE) The Xpert Xpress SARS-CoV-2/FLU/RSV plus assay is intended as an aid in the diagnosis of influenza from Nasopharyngeal swab specimens and should not be used as a sole basis for treatment. Nasal washings and aspirates are unacceptable for Xpert Xpress SARS-CoV-2/FLU/RSV testing.  Fact Sheet for Patients: BloggerCourse.com  Fact Sheet for Healthcare Providers: SeriousBroker.it  This test is not yet approved or cleared by the [place] FDA and has been authorized for detection and/or diagnosis of SARS-CoV-2 by FDA under an Emergency Use Authorization (EUA). This EUA will remain in effect (meaning this test can be used) for the duration of the COVID-19 declaration under Section 564(b)(1) of the Act, 21 U.S.C. section 360bbb-3(b)(1), unless the authorization is terminated or revoked.  Performed at [facility], [address] [place], [place] [address]   Blood culture (routine x 2)     Status: None (Preliminary result)   Collection Time: 01/14/21  8:35 PM   Specimen: BLOOD  Result Value Ref Range Status   Specimen Description   Final    BLOOD RIGHT ANTECUBITAL Performed at [facility], [address], [place], [place] [address]    Special Requests   Final    BOTTLES DRAWN AEROBIC AND ANAEROBIC Blood Culture results may not be optimal due to an excessive volume of blood received in culture bottles Performed at [facility], [address] [place], [place] [address]    Culture   Final    NO GROWTH 3 DAYS Performed at [facility], [address] [place], [place] [address]    Report Status PENDING  Incomplete  Blood culture (routine x 2)     Status: None (Preliminary result)   Collection Time: 01/14/21  8:45 PM   Specimen: BLOOD  Result Value Ref  Range Status   Specimen Description   Final    BLOOD LEFT ANTECUBITAL Performed at [facility], [address], [place], [place] [address]    Special Requests   Final    BOTTLES DRAWN AEROBIC AND ANAEROBIC Blood Culture results may not be optimal due to an excessive volume of blood received in culture bottles Performed at [facility], [address] [place], [place] [address]    Culture   Final    NO GROWTH 3 DAYS Performed at [facility], [address], [place], [place] [address]    Report Status PENDING  Incomplete  Culture, blood (single)     Status: None (Preliminary result)   Collection Time: 01/16/21  9:17 AM   Specimen: Right Antecubital; Blood  Result Value Ref Range Status   Specimen Description   Final    RIGHT ANTECUBITAL Performed at [facility], [address] [place], [place] [address]    Special Requests   Final    Blood Culture adequate volume Performed at [facility], [address], [place], [place] [address]    Culture   Final    NO GROWTH 1 DAY Performed at [facility], [address] [place], [place] [address]    Report Status PENDING  Incomplete  Resp Panel by RT-PCR (Flu A&B, Covid) Nasopharyngeal Swab     Status: None   Collection Time: 01/16/21 10:26 AM   Specimen: Nasopharyngeal Swab; Nasopharyngeal(NP) swabs in vial transport medium  Result Value Ref Range Status   SARS Coronavirus 2 by RT PCR NEGATIVE NEGATIVE Final    Comment: (NOTE) SARS-CoV-2 target nucleic acids are NOT DETECTED.  The SARS-CoV-2 RNA is generally detectable in upper respiratory specimens during the acute phase of infection. The lowest concentration of SARS-CoV-2 viral copies this assay can detect is 138 copies/mL. A negative result does not preclude SARS-Cov-2 infection and should not be used as the sole basis for treatment or other patient management decisions. A negative  result may occur with  improper specimen collection/handling, submission of specimen other than nasopharyngeal swab, presence of viral mutation(s) within the areas targeted by this assay, and inadequate number of viral copies(<138 copies/mL). A negative result must be combined with clinical observations, patient history, and epidemiological information. The expected result is Negative.  Fact Sheet for Patients:  BloggerCourse.com  Fact Sheet for Healthcare Providers:  SeriousBroker.it  This test is no t yet approved or cleared by the [place] FDA and  has been authorized for detection and/or diagnosis of SARS-CoV-2 by FDA under an Emergency Use Authorization (EUA). This EUA will remain  in effect (meaning this test can be used) for the duration of the COVID-19 declaration under Section 564(b)(1) of the Act, 21 U.S.C.section 360bbb-3(b)(1), unless the authorization is terminated  or revoked sooner.       Influenza A by PCR NEGATIVE NEGATIVE Final   Influenza B by PCR NEGATIVE NEGATIVE Final    Comment: (NOTE) The Xpert Xpress SARS-CoV-2/FLU/RSV plus assay is intended as an aid in the diagnosis of influenza from Nasopharyngeal swab specimens and should not be used as a sole basis for treatment. Nasal washings and aspirates are unacceptable for Xpert Xpress SARS-CoV-2/FLU/RSV testing.  Fact Sheet for Patients: BloggerCourse.com  Fact Sheet for Healthcare Providers: SeriousBroker.it  This test is not yet approved or cleared by the [place] FDA and has been authorized for detection and/or diagnosis of SARS-CoV-2 by FDA under an Emergency Use Authorization (EUA).  This EUA will remain in effect (meaning this test can be used) for the duration of the COVID-19 declaration under Section 564(b)(1) of the Act, 21 U.S.C. section 360bbb-3(b)(1), unless the authorization is  terminated or revoked.  Performed at [facility], [address] [place], [place] [address]   MRSA Next Gen by PCR, Nasal     Status: None   Collection Time: 01/17/21  8:41 AM   Specimen: Nasal Mucosa; Nasal Swab  Result Value Ref Range Status   MRSA by PCR Next Gen NOT DETECTED NOT DETECTED Final    Comment: (NOTE) The GeneXpert MRSA Assay (FDA approved for NASAL specimens only), is one component of a comprehensive MRSA colonization surveillance program. It is not intended to diagnose MRSA infection nor to guide or monitor treatment for MRSA infections. Test performance is not FDA approved in patients less than 28 years old. Performed at [facility], [address], [place], [place] [address]           Radiology Studies: US THYROID  Result Date: 01/18/2021 CLINICAL DATA:  Incidental on CT. EXAM: THYROID ULTRASOUND TECHNIQUE: Ultrasound examination of the thyroid gland and adjacent soft tissues was performed. COMPARISON:  None. FINDINGS: Parenchymal Echotexture: Mildly heterogenous Isthmus: 0.2 cm Right lobe: 3.9 x 1.4 x 1.2 cm Left lobe: 3.7 x 1.5 x 1.7 cm _________________________________________________________ Estimated total number of nodules >/= 1 cm: 1 Number of spongiform nodules >/=  2 cm not described below (TR1): 0 Number of mixed cystic and solid nodules >/= 1.5 cm not described below (TR2): 0 _________________________________________________________ Nodule labeled 1 refers to a solid isoechoic nodule (TR 3) in the mid left thyroid lobe measuring 2.0 x 1.0 x 1.2 cm. *Given size (>/= 1.5 - 2.4 cm) and appearance, a follow-up ultrasound in 1 year should be considered based on TI-RADS criteria. IMPRESSION: There is a solitary TR 3 nodule in the left thyroid lobe measuring 2 cm which meets criteria for follow-up ultrasound in 1 year. The above is in keeping with the ACR TI-RADS recommendations - J Am Coll Radiol 2017;14:587-595.  Electronically Signed   By: Olive Bass M.D.   On: 01/18/2021 09:33        Scheduled Meds:  acyclovir  800 mg Oral Daily   atorvastatin  40 mg Oral Daily   azaTHIOprine  100 mg Oral Daily   enoxaparin (LOVENOX) injection  40 mg Subcutaneous Q24H   FLUoxetine  10 mg Oral q morning   fluticasone  1 spray Each Nare Daily   insulin aspart  0-9 Units Subcutaneous TID WC   ipratropium-albuterol  3 mL Nebulization BID   latanoprost  1 drop Both Eyes QHS   multivitamin with minerals  1 tablet Oral Daily   omega-3 acid ethyl esters  1 g Oral Daily   timolol  1 drop Left Eye BID   Continuous Infusions:  sodium chloride Stopped (01/16/21 1522)   sodium chloride Stopped (01/16/21 1522)   ceFEPime (MAXIPIME) IV 2 g (01/18/21 1131)     LOS: 1 day        Kathlen Mody, MD [facility]   To contact the attending provider between 7A-7P or the covering provider during after hours 7P-7A, please log into the web site [email or web address] and access using universal [place] password for that web site. If you do not have the password, please call the hospital operator.  01/18/2021, 2:54 PM

## 2021-01-18 NOTE — Progress Notes (Signed)
Found patient on room air with saturation =51% RA. The patient had just used the commode chair and had her oxygen off to do so. A rapid response was called for assisting patient. Patient was placed on 100% NRB during this time. Then patient was weaned down to a 9L HFNC and she is currently maintaining saturations at 94-96% at rest. Patient appears to be in no distress at this time. RT will continue to monitor.

## 2021-01-18 NOTE — Consult Note (Signed)
NAME:  Christina Higgins, MRN:  [id number], DOB:  1951/01/25, LOS: 1 ADMISSION DATE:  01/16/2021, CONSULTATION DATE:  01/18/2021  REFERRING MD:  Karleen Hampshire, CHIEF COMPLAINT: Hypoxia, cough  History of Present Illness:  70 year old never smoker, immunocompromised patient on Imuran and Humira injections , presented with cough, shortness of breath and failure of outpatient antibiotics over 3 weeks, chest x-ray showing bilateral interstitial alveolar infiltrates. She is vaccinated and boosted, keeps her grandkids aged 41 and 49, had COVID infection around October 10 , then a week later developed sinusitis type symptoms, treated with amoxicillin initially then doxycycline, ED visit on 11/15 , chest x-ray showing right more than left multifocal infiltrates, given Z-Pak Readmitted on 11/17 for worsening dyspnea and bilateral infiltrates. 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 Manuella Ghazi  [facility] Events: Including procedures, antibiotic start and stop dates in addition to other pertinent events     Interim History / Subjective:    Objective   Blood pressure (!) 97/52, pulse 86, temperature 98.4 F (36.9 C), temperature source Oral, resp. rate 18, height 5' (1.524 m), weight 63.2 kg, SpO2 92 %.        Intake/Output Summary (Last 24 hours) at 01/18/2021 1537 Last data filed at 01/18/2021 0600 Gross per 24 hour  Intake 503.5 ml  Output --  Net 503.5 ml   Filed Weights   01/16/21 0911 01/16/21 1701  Weight: 62.6 kg 63.2 kg    Examination: Gen. Pleasant, well-nourished, in no distress, normal affect ENT - no pallor,icterus, no post nasal drip Neck: No JVD, no  thyromegaly, no carotid bruits Lungs: no use of accessory muscles, no dullness to percussion, bibasal crackles, able to speak in full sentences Cardiovascular: Rhythm regular, heart sounds  normal, no murmurs or gallops, no peripheral edema Abdomen: soft and non-tender, no hepatosplenomegaly, BS normal. 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  Recommend -Check respiratory panel PCR for viral pathogens -Urine strep antigen and Legionella for completion -Continue IV cefepime -Empiric IV Solu-Medrol 125 every 8 -discussed risks and benefits of doing this with patient and her husband at the bedside -Empiric PJP coverage with IV Bactrim -allergy clarified, patient received this 15 years ago and this was rash -Hold Imuran   Best Practice (right click and "Reselect all SmartList Selections" daily)   Diet/type: Regular consistency (see orders) DVT prophylaxis: LMWH GI prophylaxis: N/A Lines: N/A Foley:  N/A Code Status:  full code Last date of multidisciplinary goals of care discussion [NA]  Labs   CBC: Recent Labs  Lab 01/14/21 2049 01/16/21 0917 01/17/21 0341  WBC 4.7 4.1 4.5  NEUTROABS  --  3.3  --   HGB 13.0 12.4 11.6*  HCT 40.1  37.8 35.5*  MCV 95.5 95.9 96.2  PLT 217 216 184    Basic Metabolic Panel: Recent Labs  Lab 01/14/21 2049 01/16/21 0917 01/16/21 1342 01/17/21 0341  NA 139 139  --  139  K  4.2 3.8  --  3.7  CL 102 105  --  109  CO2 28 23  --  23  GLUCOSE 137* 177*  --  136*  BUN 17 13  --  9  CREATININE 0.71 0.64  --  0.42*  CALCIUM 9.5 9.4  --  8.4*  MG  --   --  1.9  --   PHOS  --   --  2.7  --    GFR: Estimated Creatinine Clearance: 55.1 mL/min (A) (by C-G formula based on SCr of 0.42 mg/dL (L)). Recent Labs  Lab 01/14/21 2049 01/16/21 0917 01/16/21 1210 01/16/21 1342 01/17/21 0341 01/18/21 0355  PROCALCITON  --   --   --  1.28 <0.10 0.11  WBC 4.7 4.1  --   --  4.5  --   LATICACIDVEN 1.4 2.1* 1.6  --   --   --     Liver Function Tests: Recent Labs  Lab 01/14/21 2049 01/16/21 0917 01/17/21 0341  AST 31 34 33  ALT 22 16 18   ALKPHOS 50 43 43  BILITOT 0.7 0.8 0.7  PROT 6.5 6.3* 5.3*  ALBUMIN 3.8 3.7 2.7*   No results for input(s): LIPASE, AMYLASE in the last 168 hours. No results for input(s): AMMONIA in the last 168 hours.  ABG    Component Value Date/Time   PHART 7.433 01/18/2021 1005   PCO2ART 31.8 (L) 01/18/2021 1005   PO2ART 59.0 (L) 01/18/2021 1005   HCO3 20.9 01/18/2021 1005   ACIDBASEDEF 2.3 (H) 01/18/2021 1005   O2SAT 89.9 01/18/2021 1005     Coagulation Profile: No results for input(s): INR, PROTIME in the last 168 hours.  Cardiac Enzymes: No results for input(s): CKTOTAL, CKMB, CKMBINDEX, TROPONINI in the last 168 hours.  HbA1C: Hgb A1c MFr Bld  Date/Time Value Ref Range Status  01/16/2021 06:33 PM 6.1 (H) 4.8 - 5.6 % Final    Comment:    (NOTE) Pre diabetes:          5.7%-6.4%  Diabetes:              >6.4%  Glycemic control for   <7.0% adults with diabetes     CBG: Recent Labs  Lab 01/17/21 1154 01/17/21 1620 01/17/21 2129 01/18/21 0854 01/18/21 1240  GLUCAP 105* 116* 138* 116* 148*    Review of Systems:   Constitutional: negative for anorexia, fevers and sweats  Eyes: positive for irritation, redness and visual disturbance  Ears, nose, mouth, throat, and face: negative for earaches, epistaxis, nasal  congestion and sore throat  Respiratory: negative for sputum and wheezing  Cardiovascular: negative for chest pain, lower extremity edema, orthopnea, palpitations and syncope  Gastrointestinal: negative for abdominal pain, constipation, diarrhea, melena, nausea and vomiting  Genitourinary:negative for dysuria, frequency and hematuria  Hematologic/lymphatic: negative for bleeding, easy bruising and lymphadenopathy  Musculoskeletal:negative for arthralgias, muscle weakness and stiff joints  Neurological: negative for coordination problems, gait problems, headaches and weakness  Endocrine: negative for diabetic symptoms including polydipsia, polyuria and weight loss   Past Medical History:  She,  has a past medical history of Arthritis, Diabetes mellitus without complication (HCC) (07/24/2019), Glaucoma, and Hyperlipidemia.   Surgical History:   Past Surgical History:  Procedure Laterality Date  TUBAL LIGATION       Social History:   reports that she has never smoked. She has never used smokeless tobacco. She reports that she does not drink alcohol and does not use drugs.   Family History:  Her family history includes Breast cancer in her mother; Cancer in her father and mother; Heart disease in her mother; Hypertension in her mother; Stroke in her mother.   Allergies Allergies  Allergen Reactions   Septra [Sulfamethoxazole-Trimethoprim] Rash     Home Medications  Prior to Admission medications   Medication Sig Start Date End Date Taking? Authorizing Provider  acyclovir (ZOVIRAX) 800 MG tablet Take 800 mg by mouth daily. 01/04/21  Yes [provider]  atorvastatin (LIPITOR) 40 MG tablet Take 40 mg by mouth daily.   Yes [provider]  azaTHIOprine (IMURAN) 50 MG tablet Take 100 mg by mouth daily. 10/28/20  Yes [provider]  azithromycin (ZITHROMAX Z-PAK) 250 MG tablet Take 1 tablet (250 mg total) by mouth daily for 4 days. 01/15/21 01/19/21 Yes  Lajean Saver, MD  cefdinir (OMNICEF) 300 MG capsule Take 1 capsule (300 mg total) by mouth 2 (two) times daily. 01/14/21  Yes Lajean Saver, MD  FLUoxetine (PROZAC) 10 MG capsule Take 10 mg by mouth every morning. 01/02/21  Yes [provider]  HUMIRA PEN 40 MG/0.4ML PNKT Inject 40 mg as directed See admin instructions. Sub-q every 2 weeks 12/31/20  Yes [provider]  latanoprost (XALATAN) 0.005 % ophthalmic solution Place 1 drop into both eyes at bedtime. 12/31/20  Yes [provider]  METAMUCIL FIBER PO Take 1 capsule by mouth daily.   Yes [provider]  Multiple Vitamin (MULTIVITAMIN) tablet Take 1 tablet by mouth daily.   Yes [provider]  Omega-3 Fatty Acids (OMEGA 3 500 PO) Take 500 mg by mouth daily.   Yes [provider]  timolol (TIMOPTIC) 0.5 % ophthalmic solution Place 1 drop into the left eye 2 (two) times daily. 06/26/19  Yes [provider]  benzonatate (TESSALON) 100 MG capsule Take 1-2 capsules (100-200 mg total) by mouth 3 (three) times daily as needed for cough. Patient not taking: Reported on 01/16/2021 02/25/15   Ezekiel Slocumb, PA-C  chlorpheniramine-HYDROcodone [facility] PENNKINETIC ER) 10-8 MG/5ML SUER Take 5 mLs by mouth every 12 (twelve) hours as needed for cough. Patient not taking: Reported on 01/16/2021 02/25/15   Ezekiel Slocumb, PA-C  ipratropium (ATROVENT) 0.03 % nasal spray Place 2 sprays into both nostrils 2 (two) times daily. Patient not taking: Reported on 01/16/2021 02/25/15   Ezekiel Slocumb, PA-C     Kara Mead MD. FCCP. [place] [facility] Pager : [phone]  If no response to pager , please call [phone] until 7 pm After 7:00 pm call Elink  [phone]   01/18/2021

## 2021-01-18 NOTE — Progress Notes (Signed)
The patient FIO2 was increased to 10L HF to maintain saturations 96%.

## 2021-01-19 DIAGNOSIS — J189 Pneumonia, unspecified organism: Secondary | ICD-10-CM | POA: Diagnosis not present

## 2021-01-19 DIAGNOSIS — J9601 Acute respiratory failure with hypoxia: Secondary | ICD-10-CM | POA: Diagnosis not present

## 2021-01-19 LAB — CULTURE, BLOOD (ROUTINE X 2)
Culture: NO GROWTH
Culture: NO GROWTH

## 2021-01-19 LAB — GLUCOSE, CAPILLARY
Glucose-Capillary: 131 mg/dL — ABNORMAL HIGH (ref 70–99)
Glucose-Capillary: 135 mg/dL — ABNORMAL HIGH (ref 70–99)
Glucose-Capillary: 145 mg/dL — ABNORMAL HIGH (ref 70–99)
Glucose-Capillary: 149 mg/dL — ABNORMAL HIGH (ref 70–99)
Glucose-Capillary: 159 mg/dL — ABNORMAL HIGH (ref 70–99)
Glucose-Capillary: 187 mg/dL — ABNORMAL HIGH (ref 70–99)
Glucose-Capillary: 241 mg/dL — ABNORMAL HIGH (ref 70–99)

## 2021-01-19 LAB — CBC
HCT: 33.5 % — ABNORMAL LOW (ref 36.0–46.0)
Hemoglobin: 11 g/dL — ABNORMAL LOW (ref 12.0–15.0)
MCH: 31.7 pg (ref 26.0–34.0)
MCHC: 32.8 g/dL (ref 30.0–36.0)
MCV: 96.5 fL (ref 80.0–100.0)
Platelets: 217 10*3/uL (ref 150–400)
RBC: 3.47 MIL/uL — ABNORMAL LOW (ref 3.87–5.11)
RDW: 13.9 % (ref 11.5–15.5)
WBC: 5.4 10*3/uL (ref 4.0–10.5)
nRBC: 0 % (ref 0.0–0.2)

## 2021-01-19 LAB — BASIC METABOLIC PANEL
Anion gap: 7 (ref 5–15)
BUN: 14 mg/dL (ref 8–23)
CO2: 22 mmol/L (ref 22–32)
Calcium: 8.2 mg/dL — ABNORMAL LOW (ref 8.9–10.3)
Chloride: 108 mmol/L (ref 98–111)
Creatinine, Ser: 0.42 mg/dL — ABNORMAL LOW (ref 0.44–1.00)
GFR, Estimated: >60 mL/min (ref >60)
Glucose, Bld: 173 mg/dL — ABNORMAL HIGH (ref 70–99)
Potassium: 3.9 mmol/L (ref 3.5–5.1)
Sodium: 137 mmol/L (ref 135–145)

## 2021-01-19 LAB — LEGIONELLA PNEUMOPHILA SEROGP 1 UR AG: L. pneumophila Serogp 1 Ur Ag: NEGATIVE

## 2021-01-19 MED ORDER — LIP MEDEX EX OINT
TOPICAL_OINTMENT | CUTANEOUS | Status: DC | PRN
  Administered 2021-01-23 (×2): 1 via TOPICAL
  Filled 2021-01-19: qty 7

## 2021-01-19 MED ORDER — CHLORHEXIDINE GLUCONATE 0.12 % MT SOLN
15.0000 mL | Freq: Two times a day (BID) | OROMUCOSAL | Status: DC
  Administered 2021-01-19 – 2021-01-28 (×16): 15 mL via OROMUCOSAL
  Filled 2021-01-19 (×17): qty 15

## 2021-01-19 MED ORDER — ORAL CARE MOUTH RINSE
15.0000 mL | Freq: Two times a day (BID) | OROMUCOSAL | Status: DC
  Administered 2021-01-19 – 2021-01-28 (×17): 15 mL via OROMUCOSAL

## 2021-01-19 MED ORDER — SODIUM CHLORIDE 0.9 % IV SOLN
2.0000 g | Freq: Two times a day (BID) | INTRAVENOUS | Status: AC
  Administered 2021-01-19 – 2021-01-23 (×9): 2 g via INTRAVENOUS
  Filled 2021-01-19 (×9): qty 2

## 2021-01-19 MED ORDER — SULFAMETHOXAZOLE-TRIMETHOPRIM 400-80 MG/5ML IV SOLN
320.0000 mg | Freq: Four times a day (QID) | INTRAVENOUS | Status: DC
  Administered 2021-01-19 – 2021-01-21 (×8): 320 mg via INTRAVENOUS
  Filled 2021-01-19 (×11): qty 20

## 2021-01-19 MED ORDER — DIPHENHYDRAMINE HCL 25 MG PO CAPS: 25.0000 mg | ORAL_CAPSULE | Freq: Four times a day (QID) | ORAL | Status: DC | PRN

## 2021-01-19 MED ORDER — FUROSEMIDE 10 MG/ML IJ SOLN
20.0000 mg | Freq: Once | INTRAMUSCULAR | Status: AC
  Administered 2021-01-19: 20 mg via INTRAVENOUS
  Filled 2021-01-19: qty 2

## 2021-01-19 MED ORDER — DIPHENOXYLATE-ATROPINE 2.5-0.025 MG PO TABS
1.0000 | ORAL_TABLET | Freq: Four times a day (QID) | ORAL | Status: AC | PRN
  Administered 2021-01-19: 1 via ORAL
  Filled 2021-01-19: qty 1

## 2021-01-19 NOTE — Consult Note (Addendum)
NAME:  Christina Higgins, MRN:  [id number], DOB:  09-09-1950, LOS: 2 ADMISSION DATE:  01/16/2021, CONSULTATION DATE:  01/19/2021  REFERRING MD:  Blake Divine, CHIEF COMPLAINT: Hypoxia, cough  History of Present Illness:  70 year old never smoker, immunocompromised patient on Imuran and Humira injections , presented with cough, shortness of breath and failure of outpatient antibiotics over 3 weeks, chest x-ray showing bilateral interstitial alveolar infiltrates. She is vaccinated and boosted, keeps her grandkids aged 65 and 24, had COVID infection around October 10 , then a week later developed sinusitis type symptoms, treated with amoxicillin initially then doxycycline, ED visit on 11/15 , chest x-ray showing right more than left multifocal infiltrates, given Z-Pak Readmitted on 11/17 for worsening dyspnea and bilateral infiltrates. 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 Sherryll Burger  [facility] Events: Including procedures, antibiotic start and stop dates in addition to other pertinent events   11/19 worsening hypoxia requiring transfer to ICU  Interim History / Subjective:  She was transferred to ICU yesterday evening due to increasing hypoxia. On nasal cannula and nonrebreather this morning  Objective   Blood pressure (!) 102/51, pulse (!) 58, temperature (!) 96.9 F (36.1 C), temperature source Axillary, resp. rate (!) 31, height 5' (1.524 m), weight 65.3 kg, SpO2 95 %.    FiO2 (%):  [85 %] 85 %   Intake/Output Summary (Last 24 hours) at 01/19/2021 1051 Last data filed at 01/19/2021 0344 Gross per 24 hour  Intake 300 ml  Output 350 ml  Net -50 ml    Filed Weights    01/16/21 0911 01/16/21 1701 01/18/21 1849  Weight: 62.6 kg 63.2 kg 65.3 kg    Examination: Gen. Pleasant, well-nourished, normal affect, sitting up in bed, able to speak in full sentences ENT - no pallor,icterus, no post nasal drip Neck: No JVD, no thyromegaly, no carotid bruits Lungs: no use of accessory muscles, bibasal crackles, no rhonchi Cardiovascular: Rhythm regular, heart sounds  normal, no murmurs or gallops, no peripheral edema Abdomen: soft and non-tender, no hepatosplenomegaly, BS normal. Musculoskeletal: No deformities, no cyanosis or clubbing Neuro:  alert, non focal   Chest x-ray 11/19 independently reviewed shows increasing opacity both lower lobes Labs show no leukocytosis, normal electrolytes  Resolved Hospital Problem list     Assessment & Plan:  Differential diagnosis of bilateral infiltrates in this immunocompromised host on Imuran is broad and includes post COVID pneumonitis (time course makes this less likely ) , other viral etiologies including RSV and metapneumovirus, worsening of underlying inflammatory condition as suggested by uveitis -acute idiopathic pneumonitis -PJP infection remains a possibility but she is allergic to Bactrim  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 -High risk for mechanical ventilation  especially if work of breathing increases or worsening hypoxia -If gets intubated we can send BAL for  above   Updated husband and son at bedside PCCM can assume care  Best Practice (right click and "Reselect all SmartList Selections" daily)   Diet/type: Regular consistency (see orders) DVT prophylaxis: LMWH GI prophylaxis: N/A Lines: N/A Foley:  N/A Code Status:  full code Last date of multidisciplinary goals of care discussion [NA]  Labs   CBC: Recent Labs  Lab 01/14/21 2049 01/16/21 0917 01/17/21 0341 01/19/21 0245  WBC 4.7 4.1 4.5 5.4  NEUTROABS  --  3.3  --   --   HGB 13.0 12.4 11.6* 11.0*  HCT 40.1 37.8 35.5* 33.5*  MCV 95.5 95.9 96.2 96.5  PLT 217 216 184 217     Basic Metabolic Panel: Recent Labs  Lab 01/14/21 2049 01/16/21 0917 01/16/21 1342 01/17/21 0341 01/19/21 0245  NA 139 139  --  139 137  K 4.2 3.8  --  3.7 3.9  CL 102 105  --  109 108  CO2 28 23  --  23 22  GLUCOSE 137* 177*  --  136* 173*  BUN 17 13  --  9 14  CREATININE 0.71 0.64  --  0.42* 0.42*  CALCIUM 9.5 9.4  --  8.4* 8.2*  MG  --   --  1.9  --   --   PHOS  --   --  2.7  --   --     GFR: Estimated Creatinine Clearance: 55.9 mL/min (A) (by C-G formula based on SCr of 0.42 mg/dL (L)). Recent Labs  Lab 01/14/21 2049 01/16/21 0917 01/16/21 1210 01/16/21 1342 01/17/21 0341 01/18/21 0355 01/19/21 0245  PROCALCITON  --   --   --  1.28 <0.10 0.11  --   WBC 4.7 4.1  --   --  4.5  --  5.4  LATICACIDVEN 1.4 2.1* 1.6  --   --   --   --      Liver Function Tests: Recent Labs  Lab 01/14/21 2049 01/16/21 0917 01/17/21 0341  AST 31 34 33  ALT 22 16 18   ALKPHOS 50 43 43  BILITOT 0.7 0.8 0.7  PROT 6.5 6.3* 5.3*  ALBUMIN 3.8 3.7 2.7*    No results for input(s): LIPASE, AMYLASE in the last 168 hours. No results for input(s): AMMONIA in the last 168 hours.  ABG    Component Value Date/Time   PHART 7.433 01/18/2021 1005   PCO2ART 31.8 (L) 01/18/2021 1005   PO2ART 59.0 (L) 01/18/2021 1005   HCO3 20.9 01/18/2021 1005   ACIDBASEDEF 2.3 (H) 01/18/2021 1005   O2SAT 89.9  01/18/2021 1005      Coagulation Profile: No results for input(s): INR, PROTIME in the last 168 hours.  Cardiac Enzymes: No results for input(s): CKTOTAL, CKMB, CKMBINDEX, TROPONINI in the last 168 hours.  HbA1C: Hgb A1c MFr Bld  Date/Time Value Ref Range Status  01/16/2021 06:33 PM 6.1 (H) 4.8 - 5.6 % Final    Comment:    (NOTE) Pre diabetes:          5.7%-6.4%  Diabetes:              >6.4%  Glycemic control for   <7.0% adults with diabetes     CBG: Recent Labs  Lab 01/18/21 1800 01/18/21 1927 01/19/21 0001 01/19/21 0317 01/19/21 0749  GLUCAP 110* 166* 187* 145* 131*     My independent critical care time  x 35 m , including periodic reassessment and coordinating care with team members including respiratory therapy  Cyril Mourning MD. FCCP. [place] [facility] Pager : [phone]  If no response to pager , please call [phone] until 7 pm After 7:00 pm call Elink  [phone]   01/19/2021

## 2021-01-19 NOTE — Progress Notes (Addendum)
Pharmacy Antibiotic Note  Christina Higgins is an immunocompromised 69 y.o. female admitted on 01/16/2021 with bilateral pneumonia.  Pharmacy consulted for Cefepime dosing.  Additionally, Pharmacy consulted to dose Bactrim IV empirically for PJP.  Bactrim allergy clarified - patient received 15 years ago and experienced rash 11/19 1735 Bactrim 400-80 mg tablet administered with no allergy reaction or intolerance as of 11/20 mid morning.  Plan: Continue Cefepime 2g IV q12h Begin Bactrim 320 mg (approximately 20 mg/kg) IV every 6 hours Follow up clinical progress, renal function, respiratory panel by PCR, pneumocystis smear, culture results, and clinical course.   Height: 5' (152.4 cm) Weight: 65.3 kg (143 lb 15.4 oz) IBW/kg (Calculated) : 45.5  Temp (24hrs), Avg:98.4 F (36.9 C), Min:96.9 F (36.1 C), Max:99.1 F (37.3 C)  Recent Labs  Lab 01/14/21 2049 01/16/21 0917 01/16/21 1210 01/17/21 0341 01/19/21 0245  WBC 4.7 4.1  --  4.5 5.4  CREATININE 0.71 0.64  --  0.42* 0.42*  LATICACIDVEN 1.4 2.1* 1.6  --   --      Estimated Creatinine Clearance: 55.9 mL/min (A) (by C-G formula based on SCr of 0.42 mg/dL (L)).    Allergies  Allergen Reactions   Septra [Sulfamethoxazole-Trimethoprim] Rash     Thank you for allowing pharmacy to be a part of this patient's care.  Selinda Eon, PharmD, BCPS Clinical Pharmacist [place] Please utilize Amion for appropriate phone number to reach the unit pharmacist [facility]) 01/19/2021 9:04 AM

## 2021-01-20 ENCOUNTER — Inpatient Hospital Stay (HOSPITAL_COMMUNITY): Payer: Medicare Other

## 2021-01-20 DIAGNOSIS — J80 Acute respiratory distress syndrome: Secondary | ICD-10-CM | POA: Diagnosis not present

## 2021-01-20 LAB — BASIC METABOLIC PANEL
Anion gap: 8 (ref 5–15)
BUN: 15 mg/dL (ref 8–23)
CO2: 23 mmol/L (ref 22–32)
Calcium: 8.3 mg/dL — ABNORMAL LOW (ref 8.9–10.3)
Chloride: 105 mmol/L (ref 98–111)
Creatinine, Ser: 0.55 mg/dL (ref 0.44–1.00)
GFR, Estimated: >60 mL/min (ref >60)
Glucose, Bld: 189 mg/dL — ABNORMAL HIGH (ref 70–99)
Potassium: 3.5 mmol/L (ref 3.5–5.1)
Sodium: 136 mmol/L (ref 135–145)

## 2021-01-20 LAB — GLUCOSE, CAPILLARY
Glucose-Capillary: 164 mg/dL — ABNORMAL HIGH (ref 70–99)
Glucose-Capillary: 193 mg/dL — ABNORMAL HIGH (ref 70–99)
Glucose-Capillary: 122 mg/dL — ABNORMAL HIGH (ref 70–99)
Glucose-Capillary: 130 mg/dL — ABNORMAL HIGH (ref 70–99)
Glucose-Capillary: 187 mg/dL — ABNORMAL HIGH (ref 70–99)

## 2021-01-20 LAB — CBC
HCT: 34.1 % — ABNORMAL LOW (ref 36.0–46.0)
Hemoglobin: 11.3 g/dL — ABNORMAL LOW (ref 12.0–15.0)
MCH: 32 pg (ref 26.0–34.0)
MCHC: 33.1 g/dL (ref 30.0–36.0)
MCV: 96.6 fL (ref 80.0–100.0)
Platelets: 270 10*3/uL (ref 150–400)
RBC: 3.53 MIL/uL — ABNORMAL LOW (ref 3.87–5.11)
RDW: 14 % (ref 11.5–15.5)
WBC: 11.6 10*3/uL — ABNORMAL HIGH (ref 4.0–10.5)
nRBC: 0 % (ref 0.0–0.2)

## 2021-01-20 LAB — MAGNESIUM: Magnesium: 2.4 mg/dL (ref 1.7–2.4)

## 2021-01-20 LAB — PNEUMOCYSTIS JIROVECI SMEAR BY DFA: Pneumocystis jiroveci Ag: NEGATIVE

## 2021-01-20 LAB — PHOSPHORUS: Phosphorus: 2.5 mg/dL (ref 2.5–4.6)

## 2021-01-20 MED ORDER — IPRATROPIUM-ALBUTEROL 0.5-2.5 (3) MG/3ML IN SOLN
3.0000 mL | Freq: Three times a day (TID) | RESPIRATORY_TRACT | Status: DC
Start: 2021-01-20 — End: 2021-01-28
  Administered 2021-01-20 – 2021-01-28 (×24): 3 mL via RESPIRATORY_TRACT
  Filled 2021-01-20 (×25): qty 3

## 2021-01-20 MED ORDER — POTASSIUM CHLORIDE CRYS ER 20 MEQ PO TBCR
40.0000 meq | EXTENDED_RELEASE_TABLET | Freq: Once | ORAL | Status: AC
  Administered 2021-01-20: 40 meq via ORAL
  Filled 2021-01-20: qty 2

## 2021-01-20 MED ORDER — FUROSEMIDE 10 MG/ML IJ SOLN
40.0000 mg | Freq: Once | INTRAMUSCULAR | Status: AC
  Administered 2021-01-20: 40 mg via INTRAVENOUS
  Filled 2021-01-20: qty 4

## 2021-01-20 MED ORDER — MELATONIN 3 MG PO TABS
3.0000 mg | ORAL_TABLET | Freq: Every day | ORAL | Status: DC
  Administered 2021-01-20 – 2021-01-27 (×8): 3 mg via ORAL
  Filled 2021-01-20 (×8): qty 1

## 2021-01-20 NOTE — Progress Notes (Addendum)
While on bed side commode to urine PT began experiencing rapid hypoxic event with SaO2 down to 54% with appropriate pulse Ox waveform. PT also reports feeling like may pass out. PT was placed back on bed and supplemented her heated high flow [place] with a NRBM. HHFNC was flowing 90% with 30 L/min at time of event.  Late charting.   Please note this episode was at [facility] 01/19/2021.     01/19/21 1838  Vitals  Pulse Rate 96  ECG Heart Rate 94  Resp (!) 29  Oxygen Therapy  SpO2 (!) 57 % (Appropriate waveform and felt like passing out while on BSC.)  O2 Device HFNC;Non-rebreather Mask  MEWS Score  MEWS Temp 0  MEWS Systolic 0  MEWS Pulse 0  MEWS RR 2  MEWS LOC 0  MEWS Score 2  MEWS Score Color Yellow

## 2021-01-20 NOTE — Progress Notes (Signed)
[facility] ADULT ICU REPLACEMENT PROTOCOL   The patient does apply for the [facility] Adult ICU Electrolyte Replacment Protocol based on the criteria listed below:   1.Exclusion criteria: TCTS patients, ECMO patients, and Dialysis patients 2. Is GFR >/= 30 ml/min? Yes.    Patient's GFR today is >60 3. Is SCr </= 2? Yes.   Patient's SCr is 0.55 mg/dL 4. Did SCr increase >/= 0.5 in 24 hours? No. 5.Pt's weight >40kg  Yes.   6. Abnormal electrolyte(s): K+3.5  7. Electrolytes replaced per protocol 8.  Call MD STAT for K+ </= 2.5, Phos </= 1, or Mag </= 1 Physician:  Enzo Bi 01/20/2021 5:08 AM

## 2021-01-20 NOTE — Progress Notes (Signed)
Placed new water bottle on HHFNC system- uneventful.

## 2021-01-20 NOTE — Progress Notes (Signed)
NAME:  Christina Higgins, MRN:  [id number], DOB:  05-27-1950, LOS: 3 ADMISSION DATE:  01/16/2021, CONSULTATION DATE:  01/20/2021  REFERRING MD:  Blake Divine, CHIEF COMPLAINT: Hypoxia, cough  History of Present Illness:  70 year old never smoker, immunocompromised patient on Imuran and Humira injections , presented with cough, shortness of breath and failure of outpatient antibiotics over 3 weeks, chest x-ray showing bilateral interstitial alveolar infiltrates. She is vaccinated and boosted, keeps her grandkids aged 89 and 74, had COVID infection around October 10 , then a week later developed sinusitis type symptoms, treated with amoxicillin initially then doxycycline, ED visit on 11/15 , chest x-ray showing right more than left multifocal infiltrates, given Z-Pak Readmitted on 11/17 for worsening dyspnea and bilateral infiltrates. 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 Sherryll Burger  [facility] Events: Including procedures, antibiotic start and stop dates in addition to other pertinent events   11/19 worsening hypoxia requiring transfer to ICU  Interim History / Subjective:  100%, 45L HFNC. Desaturation to 50% last night when she went to bedside commode. Not coughing up phlegm.   Objective   Blood pressure (!) 152/72, pulse 75, temperature 98.2 F (36.8 C), temperature source Oral, resp. rate (!) 26, height 5' (1.524 m), weight 65.3 kg, SpO2 92 %.    FiO2 (%):  [85 %-100 %] 100 %   Intake/Output Summary (Last 24 hours) at 01/20/2021 0747 Last data filed at 01/20/2021 7614 Gross per 24 hour  Intake 1781.59 ml  Output 1550 ml  Net 231.59 ml   Filed Weights   01/16/21 0911  01/16/21 1701 01/18/21 1849  Weight: 62.6 kg 63.2 kg 65.3 kg    Examination: Gen. Pleasant, well-nourished, normal affect, dyspneic as she completes sentences Neck: No JVD, no LAD Lungs: tachypneic, equal chest rise, transmitted sound of HHFNC Cardiovascular: RRR heart sounds  normal, no murmurs or gallops, trace peripheral edema Abdomen: soft and non-tender, no hepatosplenomegaly, BS normal. Musculoskeletal: No deformities, no cyanosis or clubbing Neuro:  alert, grossly non focal   CXR today shows diffuse alveolar and interstitial opacities  S Cr stable Mild leukocytosis Hb stable  Urine legionella pending RVP pending PJP smear pending   Resolved Hospital Problem list     Assessment & Plan:   # Acute hypoxemic respiratory failure # ARDS Differential diagnosis of bilateral infiltrates in this immunocompromised host on Imuran is broad and includes post COVID pneumonitis/organizing PNA, other viral etiologies including RSV and metapneumovirus, worsening of underlying inflammatory condition/ILD flare as suggested by uveitis, AIP, PJP infection. 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 Monitoring closely.  -Hold Imuran -High risk for mechanical ventilation especially if work of breathing increases or worsening hypoxia -If gets intubated we can send BAL for above    Best Practice (right click and "Reselect all SmartList Selections" daily)   Diet/type: Regular consistency (see orders) DVT prophylaxis:  LMWH GI prophylaxis: N/A Lines: N/A Foley:  N/A Code Status:  full code Last date of  multidisciplinary goals of care discussion [She will call her husband today to discuss whether or not she would like to pursue trial of time on a ventilator to see if she can recover]    My independent critical care time x 39 m , including periodic reassessment and coordinating care with team members including respiratory therapy  [facility]   If no response to pager , please call [phone] until 7 pm After 7:00 pm call Elink  [phone]   01/20/2021

## 2021-01-21 DIAGNOSIS — J9601 Acute respiratory failure with hypoxia: Secondary | ICD-10-CM | POA: Diagnosis not present

## 2021-01-21 LAB — CBC
HCT: 34.2 % — ABNORMAL LOW (ref 36.0–46.0)
Hemoglobin: 11.7 g/dL — ABNORMAL LOW (ref 12.0–15.0)
MCH: 31.9 pg (ref 26.0–34.0)
MCHC: 34.2 g/dL (ref 30.0–36.0)
MCV: 93.2 fL (ref 80.0–100.0)
Platelets: 289 10*3/uL (ref 150–400)
RBC: 3.67 MIL/uL — ABNORMAL LOW (ref 3.87–5.11)
RDW: 14.3 % (ref 11.5–15.5)
WBC: 11.7 10*3/uL — ABNORMAL HIGH (ref 4.0–10.5)
nRBC: 0 % (ref 0.0–0.2)

## 2021-01-21 LAB — RESPIRATORY PANEL BY PCR

## 2021-01-21 LAB — GLUCOSE, CAPILLARY
Glucose-Capillary: 123 mg/dL — ABNORMAL HIGH (ref 70–99)
Glucose-Capillary: 159 mg/dL — ABNORMAL HIGH (ref 70–99)
Glucose-Capillary: 134 mg/dL — ABNORMAL HIGH (ref 70–99)

## 2021-01-21 LAB — CULTURE, BLOOD (SINGLE)
Culture: NO GROWTH
Special Requests: ADEQUATE

## 2021-01-21 LAB — BASIC METABOLIC PANEL
Anion gap: 11 (ref 5–15)
BUN: 21 mg/dL (ref 8–23)
CO2: 20 mmol/L — ABNORMAL LOW (ref 22–32)
Calcium: 8.3 mg/dL — ABNORMAL LOW (ref 8.9–10.3)
Chloride: 104 mmol/L (ref 98–111)
Creatinine, Ser: 0.48 mg/dL (ref 0.44–1.00)
GFR, Estimated: >60 mL/min (ref >60)
Glucose, Bld: 129 mg/dL — ABNORMAL HIGH (ref 70–99)
Potassium: 4.1 mmol/L (ref 3.5–5.1)
Sodium: 135 mmol/L (ref 135–145)

## 2021-01-21 LAB — LEGIONELLA PNEUMOPHILA SEROGP 1 UR AG: L. pneumophila Serogp 1 Ur Ag: NEGATIVE

## 2021-01-21 MED ORDER — FUROSEMIDE 10 MG/ML IJ SOLN
40.0000 mg | Freq: Once | INTRAMUSCULAR | Status: AC
  Administered 2021-01-21: 40 mg via INTRAVENOUS
  Filled 2021-01-21: qty 4

## 2021-01-21 MED ORDER — METHYLPREDNISOLONE SODIUM SUCC 125 MG IJ SOLR
80.0000 mg | Freq: Every day | INTRAMUSCULAR | Status: DC
  Administered 2021-01-22 – 2021-01-26 (×5): 80 mg via INTRAVENOUS
  Filled 2021-01-21 (×5): qty 2

## 2021-01-21 NOTE — Progress Notes (Signed)
NAME:  Christina Higgins, MRN:  [id number], DOB:  07-06-1950, LOS: 4 ADMISSION DATE:  01/16/2021, CONSULTATION DATE:  01/21/2021  REFERRING MD:  Blake Divine, CHIEF COMPLAINT: Hypoxia, cough  History of Present Illness:  70 year old never smoker, immunocompromised patient on Imuran and Humira injections , presented with cough, shortness of breath and failure of outpatient antibiotics over 3 weeks, chest x-ray showing bilateral interstitial alveolar infiltrates. She is vaccinated and boosted, keeps her grandkids aged 57 and 35, had COVID infection around October 10 , then a week later developed sinusitis type symptoms, treated with amoxicillin initially then doxycycline, ED visit on 11/15 , chest x-ray showing right more than left multifocal infiltrates, given Z-Pak Readmitted on 11/17 for worsening dyspnea and bilateral infiltrates. 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 Sherryll Burger  [facility] Events: Including procedures, antibiotic start and stop dates in addition to other pertinent events   11/19 worsening hypoxia requiring transfer to ICU  Interim History / Subjective:   FiO2 weaned to 70%. PJP smear negative. RVP resent, negative.  Objective   Blood pressure (!) 103/59, pulse 67, temperature (!) 96.5 F (35.8 C), temperature source Axillary, resp. rate (!) 31, height 5' (1.524 m), weight 65.3 kg, SpO2 98 %.    FiO2 (%):  [80 %-100 %] 80 %   Intake/Output Summary (Last 24 hours) at 01/21/2021 0748 Last data filed at 01/21/2021 0553 Gross per 24 hour  Intake 2014.47 ml  Output 500 ml  Net 1514.47 ml   Filed Weights   01/16/21 0911 01/16/21 1701 01/18/21 1849   Weight: 62.6 kg 63.2 kg 65.3 kg    Examination: Gen. Pleasant, NAD Neck: no JVD, no LAD Lungs: tachypneic, equal chest rise, transmitted sound of HHFNC Cardiovascular: RRR, no murmurs or gallops, trace peripheral edema Abdomen: soft and non-tender, BS hypoactive. Neuro:  alert, grossly nonfocal  BMP pending CBC stable   Resolved Hospital Problem list     Assessment & Plan:   # Acute hypoxemic respiratory failure # ARDS Differential diagnosis of bilateral infiltrates in this immunocompromised host on Imuran is broad and includes post COVID pneumonitis/organizing PNA, other viral etiologies including RSV and metapneumovirus, worsening of underlying inflammatory condition/ILD flare as suggested by uveitis, AIP, PJP infection. 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 -f/u cultures -TTE -Urine strep antigen and Legionella for completion -Continue IV cefepime day 4, okay to use low-dose Lomotil for diarrhea -decrease to solumedrol 80 mg IV daily 11/23- -DC bactrim, induced sputum PJP smear is negative -diurese for net negative fluid balance -Hold Imuran -High risk for mechanical ventilation especially if work of breathing increases or worsening hypoxia -If gets intubated we can send BAL for above    Best Practice (right click and "Reselect all SmartList Selections" daily)   Diet/type: Regular consistency (see orders) DVT prophylaxis: LMWH GI prophylaxis: N/A Lines: N/A Foley:  N/A Code Status:  full code Last date of multidisciplinary goals of care discussion [AFter extensive discussion 11/21 with son, husband, and pt she remains full code]    My independent critical care time x 25 m , including periodic reassessment and coordinating care with team members including respiratory therapy  Laroy Apple Pulmonary/Critical  Care   If no response to pager , please call [phone] until 7 pm After 7:00 pm call Elink  [phone]    01/21/2021

## 2021-01-22 ENCOUNTER — Inpatient Hospital Stay (HOSPITAL_COMMUNITY): Payer: Medicare Other

## 2021-01-22 DIAGNOSIS — J9600 Acute respiratory failure, unspecified whether with hypoxia or hypercapnia: Secondary | ICD-10-CM | POA: Diagnosis not present

## 2021-01-22 DIAGNOSIS — J80 Acute respiratory distress syndrome: Secondary | ICD-10-CM | POA: Diagnosis not present

## 2021-01-22 LAB — GLUCOSE, CAPILLARY
Glucose-Capillary: 131 mg/dL — ABNORMAL HIGH (ref 70–99)
Glucose-Capillary: 104 mg/dL — ABNORMAL HIGH (ref 70–99)
Glucose-Capillary: 164 mg/dL — ABNORMAL HIGH (ref 70–99)
Glucose-Capillary: 154 mg/dL — ABNORMAL HIGH (ref 70–99)
Glucose-Capillary: 130 mg/dL — ABNORMAL HIGH (ref 70–99)

## 2021-01-22 LAB — ECHOCARDIOGRAM COMPLETE
AR max vel: 0.66 cm2
AV Area VTI: 0.68 cm2
AV Area mean vel: 0.65 cm2
AV Mean grad: 30.7 mmHg
AV Peak grad: 53.5 mmHg
Ao pk vel: 3.66 m/s
Area-P 1/2: 3.19 cm2
Height: 60 in
S' Lateral: 2.2 cm
Single Plane A4C EF: 68.6 %
Weight: 2303.37 oz

## 2021-01-22 LAB — BASIC METABOLIC PANEL
Anion gap: 8 (ref 5–15)
BUN: 33 mg/dL — ABNORMAL HIGH (ref 8–23)
CO2: 22 mmol/L (ref 22–32)
Calcium: 8.4 mg/dL — ABNORMAL LOW (ref 8.9–10.3)
Chloride: 107 mmol/L (ref 98–111)
Creatinine, Ser: 0.59 mg/dL (ref 0.44–1.00)
GFR, Estimated: >60 mL/min (ref >60)
Glucose, Bld: 115 mg/dL — ABNORMAL HIGH (ref 70–99)
Potassium: 4.3 mmol/L (ref 3.5–5.1)
Sodium: 137 mmol/L (ref 135–145)

## 2021-01-22 LAB — CBC
HCT: 33.1 % — ABNORMAL LOW (ref 36.0–46.0)
Hemoglobin: 11.4 g/dL — ABNORMAL LOW (ref 12.0–15.0)
MCH: 31.9 pg (ref 26.0–34.0)
MCHC: 34.4 g/dL (ref 30.0–36.0)
MCV: 92.7 fL (ref 80.0–100.0)
Platelets: 193 10*3/uL (ref 150–400)
RBC: 3.57 MIL/uL — ABNORMAL LOW (ref 3.87–5.11)
RDW: 14.6 % (ref 11.5–15.5)
WBC: 11 10*3/uL — ABNORMAL HIGH (ref 4.0–10.5)
nRBC: 0 % (ref 0.0–0.2)

## 2021-01-22 NOTE — Progress Notes (Signed)
Pharmacy Antibiotic Note  Christina Higgins is an immunocompromised 70 y.o. female admitted on 01/16/2021 with bilateral pneumonia.  Pharmacy consulted for Cefepime dosing.  Additionally, Pharmacy consulted to dose Bactrim IV empirically for PJP.  Day 7 Cefepime Afebrile WBC 11 stable Cultures negative to date  Plan: Continue Cefepime 2g IV q12h per current renal function What is plan for LOT? Discontinue after today to complete 7 days?   Height: 5' (152.4 cm) Weight: 65.3 kg (143 lb 15.4 oz) IBW/kg (Calculated) : 45.5  Temp (24hrs), Avg:97.6 F (36.4 C), Min:97.3 F (36.3 C), Max:98.2 F (36.8 C)  Recent Labs  Lab 01/16/21 0917 01/16/21 1210 01/17/21 0341 01/19/21 0245 01/20/21 0259 01/21/21 0805 01/22/21 0543  WBC 4.1  --  4.5 5.4 11.6* 11.7* 11.0*  CREATININE 0.64  --  0.42* 0.42* 0.55 0.48 0.59  LATICACIDVEN 2.1* 1.6  --   --   --   --   --      Estimated Creatinine Clearance: 55.9 mL/min (by C-G formula based on SCr of 0.59 mg/dL).    No Active Allergies    Thank you for allowing pharmacy to be a part of this patient's care.  Hessie Knows, PharmD, BCPS Secure Chat if ?s 01/22/2021 7:53 AM

## 2021-01-22 NOTE — Progress Notes (Signed)
  Echocardiogram 2D Echocardiogram has been performed.  Roosvelt Maser F 01/22/2021, 5:15 PM

## 2021-01-22 NOTE — Progress Notes (Signed)
   NAME:  Christina Higgins, MRN:  [id number], DOB:  [birth year], LOS: 5 ADMISSION DATE:  01/16/2021, CONSULTATION DATE:  01/22/2021  REFERRING MD:  Blake Divine, CHIEF COMPLAINT: Hypoxia, cough  History of Present Illness:  70 year old never smoker, immunocompromised patient on Imuran and Humira injections , presented with cough, shortness of breath and failure of outpatient antibiotics over 3 weeks, chest x-ray showing bilateral interstitial alveolar infiltrates. She is vaccinated and boosted, keeps her grandkids aged 13 and 22, had COVID infection around October 10 , then a week later developed sinusitis type symptoms, treated with amoxicillin initially then doxycycline, ED visit on 11/15 , chest x-ray showing right more than left multifocal infiltrates, given Z-Pak Readmitted on 11/17 for worsening dyspnea and bilateral infiltrates. 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 Sherryll Burger  [facility] Events: Including procedures, antibiotic start and stop dates in addition to other pertinent events   11/19 worsening hypoxia requiring transfer to ICU  Interim History / Subjective:   No acute issues overnight, FiO2 stable at 70%, desaturation with movement.   Objective   Blood pressure (!) 107/55, pulse 75, temperature 98.2 F (36.8 C), temperature source Oral, resp. rate (!) 26, height 5' (1.524 m), weight 65.3 kg, SpO2 95 %.    FiO2 (%):  [70 %] 70 %   Intake/Output Summary (Last 24 hours) at 01/22/2021 0740 Last data filed at 01/22/2021 0100 Gross per 24 hour  Intake 710.57 ml  Output 2265 ml  Net -1554.43 ml   Filed Weights   01/16/21 0911 01/16/21 1701 01/18/21 1849   Weight: 62.6 kg 63.2 kg 65.3 kg    Examination: Gen. Pleasant, NAD Neck: no JVD, no LAD Lungs: tachypneic, equal chest rise, transmitted sound of HHFNC, mildly increased work of breathing Cardiovascular: RRR, no murmur, trace peripheral edema Abdomen: soft and non-tender, BS hypoactive. Neuro:  alert, grossly nonfocal  BUN rising, S Cr stable Borderline leukocytosis Urine legionella neg  TTE pending   Resolved Hospital Problem list     Assessment & Plan:   # Acute hypoxemic respiratory failure # ARDS Differential diagnosis of bilateral infiltrates in this immunocompromised host on Imuran is broad and includes post COVID pneumonitis/organizing PNA, other viral etiologies including RSV and metapneumovirus, worsening of underlying inflammatory condition/ILD flare as suggested by uveitis, AIP, PJP infection. 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 -f/u TTE -Continue IV cefepime to complete 7d course -continue solumedrol 80 mg IV daily 11/23- -diurese for net negative fluid balance -Hold Imuran   Best Practice (right click and "Reselect all SmartList Selections" daily)   Diet/type: Regular consistency (see orders) DVT prophylaxis: LMWH GI prophylaxis: N/A Lines: N/A Foley:  N/A Code Status:  full code Last date of multidisciplinary goals of care discussion [AFter extensive discussion 11/21 with son, husband, and pt she remains full code]    My independent critical care time x 26 m , including periodic reassessment and coordinating care with team members including respiratory therapy  Laroy Apple Pulmonary/Critical Care   If no response to pager , please call [phone] until 7 pm After 7:00 pm call Elink  [phone]   01/22/2021

## 2021-01-22 NOTE — TOC Initial Note (Signed)
Transition of Care [facility]) - Initial/Assessment Note    Patient Details  Name: Christina Higgins MRN: [id number] Date of Birth: [birth year]  [facility]) CM/SW Contact:    Golda Acre, RN Phone Number: 01/22/2021, 7:36 AM  Clinical Narrative:                 70 year old never smoker, immunocompromised patient on Imuran and Humira injections , presented with cough, shortness of breath and failure of outpatient antibiotics over 3 weeks, chest x-ray showing bilateral interstitial alveolar infiltrates. She is vaccinated and boosted, keeps her grandkids aged 42 and 63, had COVID infection around October 10 , then a week later developed sinusitis type symptoms, treated with amoxicillin initially then doxycycline, ED visit on 11/15 , chest x-ray showing right more than left multifocal infiltrates, given Z-Pak Readmitted on 11/17 for worsening dyspnea and bilateral infiltrates. 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 Sherryll Burger   [facility] Events: Including procedures, antibiotic start and stop dates in addition to other pertinent events   11/19 worsening hypoxia requiring transfer to ICU TOC PLAN OF CARE: Following for toc needs -may need home o2 Following for progression-moved to icu on 1119, still on hfnx with partial rebreather mask at 70% Expected Discharge Plan: Home/Self Care Barriers to Discharge: Continued Medical Work up   Patient Goals and CMS Choice Patient states their goals for this hospitalization and ongoing recovery are:: to go home CMS Medicare.gov Compare Post Acute Care list provided to:: Patient    Expected Discharge Plan and  Services Expected Discharge Plan: Home/Self Care   Discharge Planning Services: CM Consult   Living arrangements for the past 2 months: Single Family Home                                      Prior Living Arrangements/Services Living arrangements for the past 2 months: Single Family Home Lives with:: Spouse Patient language and need for interpreter reviewed:: Yes Do you feel safe going back to the place where you live?: Yes            Criminal Activity/Legal Involvement Pertinent to Current Situation/Hospitalization: No - Comment as needed  Activities of Daily Living Home Assistive Devices/Equipment: Eyeglasses (libra 2 sensor) ADL Screening (condition at time of admission) Patient's cognitive ability adequate to safely complete daily activities?: Yes Is the patient deaf or have difficulty hearing?: No Does the patient have difficulty seeing, even when wearing glasses/contacts?: No Does the patient have difficulty concentrating, remembering, or making decisions?: No Patient able to express need for assistance with ADLs?: Yes Does the patient have difficulty dressing or bathing?: No Independently performs ADLs?: Yes (appropriate for developmental age) Does the patient have difficulty walking or climbing stairs?: No Weakness of Legs: Both Weakness of Arms/Hands: Both  Permission Sought/Granted                  Emotional Assessment Appearance:: Appears stated age Attitude/Demeanor/Rapport: Engaged Affect (typically observed): Calm Orientation: : Oriented to Place, Oriented to Self, Oriented to  Time, Oriented to Situation Alcohol / Substance Use: Not Applicable Psych Involvement: No (comment)  Admission diagnosis:  Hypoxia [R09.02] Bilateral pneumonia [J18.9] SIRS (systemic inflammatory response syndrome) (HCC) [R65.10] Community acquired pneumonia of right lung, unspecified part of lung [J18.9] Patient Active Problem List   Diagnosis Date Noted    Bilateral pneumonia 01/16/2021   Type 2 diabetes mellitus without complication (HCC) 08/30/2019   PCP:  Laurann Montana, MD Pharmacy:   [facility] [phone] - [place], [place] - [address] [place] [address] Phone: [phone] Fax: [phone]     Social Determinants of Health (SDOH) Interventions    Readmission Risk Interventions No flowsheet data found.

## 2021-01-23 DIAGNOSIS — I350 Nonrheumatic aortic (valve) stenosis: Secondary | ICD-10-CM | POA: Diagnosis not present

## 2021-01-23 DIAGNOSIS — J9601 Acute respiratory failure with hypoxia: Secondary | ICD-10-CM | POA: Diagnosis not present

## 2021-01-23 DIAGNOSIS — J189 Pneumonia, unspecified organism: Secondary | ICD-10-CM | POA: Diagnosis not present

## 2021-01-23 LAB — CBC
HCT: 35.2 % — ABNORMAL LOW (ref 36.0–46.0)
Hemoglobin: 12.1 g/dL (ref 12.0–15.0)
MCH: 32.1 pg (ref 26.0–34.0)
MCHC: 34.4 g/dL (ref 30.0–36.0)
MCV: 93.4 fL (ref 80.0–100.0)
Platelets: 164 10*3/uL (ref 150–400)
RBC: 3.77 MIL/uL — ABNORMAL LOW (ref 3.87–5.11)
RDW: 14.6 % (ref 11.5–15.5)
WBC: 10.7 10*3/uL — ABNORMAL HIGH (ref 4.0–10.5)
nRBC: 0 % (ref 0.0–0.2)

## 2021-01-23 LAB — GLUCOSE, CAPILLARY
Glucose-Capillary: 77 mg/dL (ref 70–99)
Glucose-Capillary: 117 mg/dL — ABNORMAL HIGH (ref 70–99)
Glucose-Capillary: 203 mg/dL — ABNORMAL HIGH (ref 70–99)
Glucose-Capillary: 138 mg/dL — ABNORMAL HIGH (ref 70–99)

## 2021-01-23 LAB — BASIC METABOLIC PANEL
Anion gap: 9 (ref 5–15)
BUN: 28 mg/dL — ABNORMAL HIGH (ref 8–23)
CO2: 21 mmol/L — ABNORMAL LOW (ref 22–32)
Calcium: 8.4 mg/dL — ABNORMAL LOW (ref 8.9–10.3)
Chloride: 104 mmol/L (ref 98–111)
Creatinine, Ser: 0.54 mg/dL (ref 0.44–1.00)
GFR, Estimated: >60 mL/min (ref >60)
Glucose, Bld: 90 mg/dL (ref 70–99)
Potassium: 4.7 mmol/L (ref 3.5–5.1)
Sodium: 134 mmol/L — ABNORMAL LOW (ref 135–145)

## 2021-01-23 LAB — BRAIN NATRIURETIC PEPTIDE: B Natriuretic Peptide: 40.3 pg/mL (ref 0.0–100.0)

## 2021-01-23 MED ORDER — SALINE SPRAY 0.65 % NA SOLN
1.0000 | NASAL | Status: DC | PRN
  Administered 2021-01-23: 1 via NASAL
  Filled 2021-01-23: qty 44

## 2021-01-23 MED ORDER — FUROSEMIDE 10 MG/ML IJ SOLN
20.0000 mg | Freq: Once | INTRAMUSCULAR | Status: AC
  Administered 2021-01-23: 20 mg via INTRAVENOUS
  Filled 2021-01-23: qty 2

## 2021-01-23 NOTE — Progress Notes (Signed)
Changed water bottle on HHFNC system- uneventful. 

## 2021-01-23 NOTE — Progress Notes (Signed)
NAME:  Christina Higgins, MRN:  [id number], DOB:  03-13-1950, LOS: 6 ADMISSION DATE:  01/16/2021, CONSULTATION DATE:  01/23/2021  REFERRING MD:  Blake Divine, CHIEF COMPLAINT: Hypoxia, cough  History of Present Illness:  70 year old never smoker, immunocompromised patient on Imuran and Humira injections , presented with cough, shortness of breath and failure of outpatient antibiotics over 3 weeks, chest x-ray showing bilateral interstitial alveolar infiltrates. She is vaccinated and boosted, keeps her grandkids aged 48 and 68, had COVID infection around October 10 , then a week later developed sinusitis type symptoms, treated with amoxicillin initially then doxycycline, ED visit on 11/15 , chest x-ray showing right more than left multifocal infiltrates, given Z-Pak Readmitted on 11/17 for worsening dyspnea and bilateral infiltrates. 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 Sherryll Burger  [facility] Events: Including procedures, antibiotic start and stop dates in addition to other pertinent events   11/19 worsening hypoxia requiring transfer to ICU 11/20 [facility] 11/23 echo moderate to severe AS, AVA 0.6 cm2  Interim History / Subjective:   Remains critically ill, on heated high flow nasal cannula at 70% with 30 L Additional nonrebreather with flap soft Afebrile  Objective   Blood pressure (!) 101/45, pulse 85, temperature 97.6 F (36.4 C), temperature source Axillary, resp. rate (!) 27, height 5' (1.524 m), weight 65.3 kg, SpO2 93 %.    FiO2 (%):  [70 %] 70 %   Intake/Output Summary (Last 24 hours) at 01/23/2021 0916 Last data filed at 01/23/2021 0600 Gross per 24 hour  Intake  199.49 ml  Output 450 ml  Net -250.51 ml    Filed Weights   01/16/21 0911 01/16/21 1701 01/18/21 1849  Weight: 62.6 kg 63.2 kg 65.3 kg    Examination: Gen. Pleasant, acutely ill-appearing, on heated high flow nasal cannula and nonrebreather Neck: no JVD, no LAD Lungs: Intermittent tachypnea, no accessory muscle use, able to speak in full sentences, crackles both bases Cardiovascular: RRR, S1-S2 regular, no edema Abdomen: soft and non-tender, BS hypoactive. 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 -Respiratory viral panel was negative, as was sputum PJP --Continue IV cefepime to complete 7d course -continue solumedrol 80 mg IV daily  -Hold Imuran   Severe AS -BNP was low and I doubt that this is related to heart failure -We will get cardiology input -diurese for net negative fluid balance  Best Practice (right click and "Reselect all SmartList Selections" daily)   Diet/type: Regular consistency (see orders) DVT prophylaxis: LMWH GI prophylaxis: N/A Lines: N/A Foley:  N/A Code Status:  full code Last date of multidisciplinary goals of care discussion [AFter extensive discussion 11/21 with son, husband, and pt she remains full code]    My independent critical care time x 31  m , including periodic reassessment and coordinating care with team members including respiratory therapy  Kiasha Bellin V. Vassie Loll MD [facility] Pulmonary/Critical Care   If no  response to pager , please call [phone] until 7 pm After 7:00 pm call Elink  [phone]   01/23/2021

## 2021-01-23 NOTE — Consult Note (Signed)
Cardiology Consultation:  Patient ID: JONELLA REDDITT MRN: [id number]; DOB: August 27, 1950  Admit date: 01/16/2021 Date of Consult: 01/23/2021  Primary Care Provider: Laurann Montana, MD Primary Cardiologist: None  Primary Electrophysiologist:  None   Patient Profile:  Naama GABI MCFATE is a 70 y.o. female with a hx of diabetes, uveitis on Humira, hyperlipidemia who is being seen today for the evaluation of aortic stenosis at the request of Cyril Mourning, MD.  History of Present Illness:  Ms. Tecson was admitted to the hospital on 01/16/2021 with acute hypoxic respiratory failure secondary to multifocal pneumonia.  She apparently was diagnosed with pneumonia in the outpatient setting on 01/14/2021.  Her condition declined and she was ultimately admitted.  She reports having COVID-19 infection in October.  Apparently since that time she had worsening shortness of breath and fatigue.  She is now admitted in the ICU requiring high flow nasal cannula oxygen.  An echocardiogram was obtained yesterday with concerns for severe arctic stenosis.  Cardiology was consulted for further evaluation.  She currently remains in the ICU.  She is hemodynamically stable.  Oxygen saturations in the mid 90s on 30 L high flow nasal cannula.  Blood pressure is soft 100/40.  She informs me her blood pressure is always low.  She is clinically euvolemic on exam.  There is no evidence of congestive heart failure.  Current laboratory data showed normal serum creatinine 0.54.  WBC 10.7.  Hemoglobin 12.1.  Platelets are stable at 164.  CT PE study dated 01/16/2021 demonstrates multifocal pneumonia.  No evidence of coronary calcium but aortic valve calcium noted.  She denies chest pain.  She does inform me that she was told she had a heart murmur last year by her primary care physician.  This was not interrogated further as it was not felt to be significant.  She is never had any heart troubles.  There is no family history of  heart disease.  Prior to this episode of pneumonia she was quite stable.    Heart Pathway Score:       Past Medical History: Past Medical History:  Diagnosis Date   Arthritis    Diabetes mellitus without complication (HCC) 07/24/2019   Glaucoma    Hyperlipidemia     Past Surgical History: Past Surgical History:  Procedure Laterality Date   TUBAL LIGATION       Home Medications:  Prior to Admission medications   Medication Sig Start Date End Date Taking? Authorizing Provider  acyclovir (ZOVIRAX) 800 MG tablet Take 800 mg by mouth daily. 01/04/21  Yes [provider]  atorvastatin (LIPITOR) 40 MG tablet Take 40 mg by mouth daily.   Yes [provider]  azaTHIOprine (IMURAN) 50 MG tablet Take 100 mg by mouth daily. 10/28/20  Yes [provider]  cefdinir (OMNICEF) 300 MG capsule Take 1 capsule (300 mg total) by mouth 2 (two) times daily. 01/14/21  Yes Cathren Laine, MD  FLUoxetine (PROZAC) 10 MG capsule Take 10 mg by mouth every morning. 01/02/21  Yes [provider]  HUMIRA PEN 40 MG/0.4ML PNKT Inject 40 mg as directed See admin instructions. Sub-q every 2 weeks 12/31/20  Yes [provider]  latanoprost (XALATAN) 0.005 % ophthalmic solution Place 1 drop into both eyes at bedtime. 12/31/20  Yes [provider]  METAMUCIL FIBER PO Take 1 capsule by mouth daily.   Yes [provider]  Multiple Vitamin (MULTIVITAMIN) tablet Take 1 tablet by mouth daily.   Yes [provider]  Omega-3 Fatty Acids (OMEGA 3 500 PO) Take 500 mg by mouth daily.   Yes [provider]  timolol (TIMOPTIC) 0.5 % ophthalmic solution Place 1 drop into the left eye 2 (two) times daily. 06/26/19  Yes [provider]  benzonatate (TESSALON) 100 MG capsule Take 1-2 capsules (100-200 mg total) by mouth 3 (three) times daily as needed for cough. Patient not taking: Reported on 01/16/2021 02/25/15   Dorna Leitz, PA-C   chlorpheniramine-HYDROcodone [facility] PENNKINETIC ER) 10-8 MG/5ML SUER Take 5 mLs by mouth every 12 (twelve) hours as needed for cough. Patient not taking: Reported on 01/16/2021 02/25/15   Dorna Leitz, PA-C  ipratropium (ATROVENT) 0.03 % nasal spray Place 2 sprays into both nostrils 2 (two) times daily. Patient not taking: Reported on 01/16/2021 02/25/15   Dorna Leitz, PA-C    Inpatient Medications: Scheduled Meds:  acyclovir  800 mg Oral Daily   atorvastatin  40 mg Oral Daily   chlorhexidine  15 mL Mouth Rinse BID   Chlorhexidine Gluconate Cloth  6 each Topical Daily   enoxaparin (LOVENOX) injection  40 mg Subcutaneous Q24H   FLUoxetine  10 mg Oral q morning   fluticasone  1 spray Each Nare Daily   furosemide  20 mg Intravenous Once   insulin aspart  0-9 Units Subcutaneous TID WC   ipratropium-albuterol  3 mL Nebulization TID   latanoprost  1 drop Both Eyes QHS   mouth rinse  15 mL Mouth Rinse q12n4p   melatonin  3 mg Oral QHS   methylPREDNISolone (SOLU-MEDROL) injection  80 mg Intravenous Daily   multivitamin with minerals  1 tablet Oral Daily   omega-3 acid ethyl esters  1 g Oral Daily   timolol  1 drop Left Eye BID   Continuous Infusions:  sodium chloride Stopped (01/16/21 1522)   sodium chloride Stopped (01/16/21 1522)   ceFEPime (MAXIPIME) IV Stopped (01/22/21 2145)   PRN Meds: sodium chloride, sodium chloride, acetaminophen **OR** acetaminophen, diphenhydrAMINE, guaiFENesin-dextromethorphan, lip balm, loratadine, ondansetron **OR** ondansetron (ZOFRAN) IV, senna-docusate  Allergies:    No Active Allergies  Social History:   Social History   Socioeconomic History   Marital status: Married    Spouse name: Not on file   Number of children: Not on file   Years of education: Not on file   Highest education level: Not on file  Occupational History   Not on file  Tobacco Use   Smoking status: Never   Smokeless tobacco: Never  Substance and Sexual Activity    Alcohol use: No    Alcohol/week: 0.0 standard drinks   Drug use: No   Sexual activity: Never  Other Topics Concern   Not on file  Social History Narrative   Not on file   Social Determinants of Health   Financial Resource Strain: Not on file  Food Insecurity: Not on file  Transportation Needs: Not on file  Physical Activity: Not on file  Stress: Not on file  Social Connections: Not on file  Intimate Partner Violence: Not on file     Family History:    Family History  Problem Relation Age of Onset   Cancer Mother    Heart disease Mother    Hypertension Mother    Stroke Mother    Breast cancer Mother        late 36's, but did have 3 times   Cancer Father      ROS:  All other ROS reviewed and negative. Pertinent  positives noted in the HPI.     Physical Exam/Data:   Vitals:   01/23/21 0730 01/23/21 0735 01/23/21 0827 01/23/21 0829  BP: (!) 101/45     Pulse: 85     Resp: (!) 41 (!) 27    Temp:    97.6 F (36.4 C)  TempSrc:    Axillary  SpO2:  96% 93%   Weight:      Height:        Intake/Output Summary (Last 24 hours) at 01/23/2021 0942 Last data filed at 01/23/2021 0600 Gross per 24 hour  Intake 199.49 ml  Output 450 ml  Net -250.51 ml    Last 3 Weights 01/18/2021 01/16/2021 01/16/2021  Weight (lbs) 143 lb 15.4 oz 139 lb 5 oz 138 lb 0.1 oz  Weight (kg) 65.3 kg 63.192 kg 62.6 kg    Body mass index is 28.12 kg/m.   General: Well nourished, well developed, in no acute distress Head: Atraumatic, normal size  Eyes: PEERLA, EOMI  Neck: Supple, no JVD Endocrine: No thryomegaly Cardiac: Normal S1, S2; RRR; 2 out of 6 systolic ejection murmur, S2 is present Lungs: Diminished breath sounds bilaterally Abd: Soft, nontender, no hepatomegaly  Ext: No edema, pulses 2+ Musculoskeletal: No deformities, BUE and BLE strength normal and equal Skin: Warm and dry, no rashes   Neuro: Alert and oriented to person, place, time, and situation, CNII-XII grossly intact,  no focal deficits  Psych: Normal mood and affect   EKG:  The EKG was personally reviewed and demonstrates:  ST 102, LVH Telemetry:  Telemetry was personally reviewed and demonstrates: Sinus rhythm in the 80s  Relevant CV Studies: TTE 01/22/2021  1. Left ventricular ejection fraction, by estimation, is 60 to 65%. The  left ventricle has normal function. The left ventricle has no regional  wall motion abnormalities. Left ventricular diastolic parameters were  normal.   2. Right ventricular systolic function is normal. The right ventricular  size is normal.   3. The mitral valve is normal in structure. No evidence of mitral valve  regurgitation. No evidence of mitral stenosis.   4. The aortic valve has an indeterminant number of cusps      There is severe calcifcation of the aortic valve      Aortic valve regurgitation is not visualized. Severe aortic valve  stenosis. Gradients suggest moderate AS (MG 32 mmHg, Vmax 3.7 m/s), but  AVA (0.6 cm^2) and DI (0.24) suggest severe AS. Low SV index (34 cc/m^2),  suspect paradoxical low flow low  gradient severe AS   Laboratory Data: High Sensitivity Troponin:   Recent Labs  Lab 01/16/21 0901 01/16/21 1210  TROPONINIHS 5 6     Cardiac EnzymesNo results for input(s): TROPONINI in the last 168 hours. No results for input(s): TROPIPOC in the last 168 hours.  Chemistry Recent Labs  Lab 01/21/21 0805 01/22/21 0543 01/23/21 0628  NA 135 137 134*  K 4.1 4.3 4.7  CL 104 107 104  CO2 20* 22 21*  GLUCOSE 129* 115* 90  BUN 21 33* 28*  CREATININE 0.48 0.59 0.54  CALCIUM 8.3* 8.4* 8.4*  GFRNONAA >60 >60 >60  ANIONGAP 11 8 9     Recent Labs  Lab 01/17/21 0341  PROT 5.3*  ALBUMIN 2.7*  AST 33  ALT 18  ALKPHOS 43  BILITOT 0.7   Hematology Recent Labs  Lab 01/21/21 0805 01/22/21 0543 01/23/21 0628  WBC 11.7* 11.0* 10.7*  RBC 3.67* 3.57* 3.77*  HGB 11.7* 11.4* 12.1  HCT 34.2* 33.1* 35.2*  MCV 93.2 92.7 93.4  MCH 31.9 31.9 32.1   MCHC 34.2 34.4 34.4  RDW 14.3 14.6 14.6  PLT 289 193 164   BNPNo results for input(s): BNP, PROBNP in the last 168 hours.  DDimer No results for input(s): DDIMER in the last 168 hours.  Radiology/Studies:  DG Chest Port 1 View  Result Date: 01/20/2021 CLINICAL DATA:  Respiratory failure. EXAM: PORTABLE CHEST 1 VIEW COMPARISON:  AP Lat 01/18/2021. FINDINGS: Cardiomegaly. There is increasing perihilar vascular congestion and moderate interstitial edema. Patchy opacities in the lung fields from the level of the aortic arch down are again noted and probably due to airspace edema, with underlying pneumonia also possible. There are small pleural effusions. No increased pleural effusion is seen. Multiple overlying monitor Wires. IMPRESSION: Increasing perihilar vascular congestion and interstitial edema. The bilateral lung opacities are not significantly changed and could be alveolar edema and / or pneumonitis. Small pleural effusions are similar. Electronically Signed   By: Almira Bar M.D.   On: 01/20/2021 05:54   ECHOCARDIOGRAM COMPLETE  Result Date: 01/22/2021    ECHOCARDIOGRAM REPORT   Patient Name:   JENNISE BOTH [facility] Date of Exam: 01/22/2021 Medical Rec #:  [id number]           Height:       60.0 in Accession #:    [id number]          Weight:       144.0 lb Date of Birth:  Apr 20, 1950           BSA:          1.623 m Patient Age:    70 years            BP:           114/49 mmHg Patient Gender: F                   HR:           73 bpm. Exam Location:  Inpatient Procedure: 2D Echo, Cardiac Doppler and Color Doppler Indications:    Respiratory distress  History:        Patient has no prior history of Echocardiogram examinations.                 Signs/Symptoms:Shortness of Breath.  Sonographer:    Roosvelt Maser RDCS Referring Phys: [id number] Ivor Costa MEIER IMPRESSIONS  1. Left ventricular ejection fraction, by estimation, is 60 to 65%. The left ventricle has normal function. The left ventricle has no  regional wall motion abnormalities. Left ventricular diastolic parameters were normal.  2. Right ventricular systolic function is normal. The right ventricular size is normal.  3. The mitral valve is normal in structure. No evidence of mitral valve regurgitation. No evidence of mitral stenosis.  4. The aortic valve has an indeterminant number of cusps     There is severe calcifcation of the aortic valve     Aortic valve regurgitation is not visualized. Severe aortic valve stenosis. Gradients suggest moderate AS (MG 32 mmHg, Vmax 3.7 m/s), but AVA (0.6 cm^2) and DI (0.24) suggest severe AS. Low SV index (34 cc/m^2), suspect paradoxical low flow low gradient severe AS FINDINGS  Left Ventricle: Left ventricular ejection fraction, by estimation, is 60 to 65%. The left ventricle has normal function. The left ventricle has no regional wall motion abnormalities. The left ventricular internal cavity size was small. There is no left ventricular hypertrophy. Left ventricular diastolic  parameters were normal. Right Ventricle: The right ventricular size is normal. No increase in right ventricular wall thickness. Right ventricular systolic function is normal. The tricuspid regurgitant velocity is 2.57 m/s, and with an assumed right atrial pressure of 3 mmHg, the estimated right ventricular systolic pressure is 29.4 mmHg. Left Atrium: Left atrial size was normal in size. Right Atrium: Right atrial size was normal in size. Pericardium: There is no evidence of pericardial effusion. Mitral Valve: The mitral valve is normal in structure. No evidence of mitral valve regurgitation. No evidence of mitral valve stenosis. Tricuspid Valve: The tricuspid valve is normal in structure. Tricuspid valve regurgitation is trivial. Aortic Valve: The aortic valve has an indeterminant number of cusps. There is severe calcifcation of the aortic valve. Aortic valve regurgitation is not visualized. Severe aortic stenosis is present. Aortic valve mean  gradient measures 30.7 mmHg. Aortic valve peak gradient measures 53.5 mmHg. Aortic valve area, by VTI measures 0.68 cm. Pulmonic Valve: The pulmonic valve was not well visualized. Pulmonic valve regurgitation is trivial. Aorta: The aortic root and ascending aorta are structurally normal, with no evidence of dilitation. IAS/Shunts: The interatrial septum was not well visualized.  LEFT VENTRICLE PLAX 2D LVIDd:         3.10 cm     Diastology LVIDs:         2.20 cm     LV e' medial:    7.83 cm/s LV PW:         0.90 cm     LV E/e' medial:  10.7 LV IVS:        0.90 cm     LV e' lateral:   7.83 cm/s LVOT diam:     1.80 cm     LV E/e' lateral: 10.7 LV SV:         53 LV SV Index:   33 LVOT Area:     2.54 cm  LV Volumes (MOD) LV vol d, MOD A4C: 70.8 ml LV vol s, MOD A4C: 22.2 ml LV SV MOD A4C:     70.8 ml RIGHT VENTRICLE RV Basal diam:  2.90 cm LEFT ATRIUM             Index        RIGHT ATRIUM           Index LA diam:        2.10 cm 1.29 cm/m   RA Area:     11.40 cm LA Vol (A2C):   52.7 ml 32.47 ml/m  RA Volume:   24.50 ml  15.09 ml/m LA Vol (A4C):   21.9 ml 13.49 ml/m LA Biplane Vol: 36.8 ml 22.67 ml/m  AORTIC VALVE AV Area (Vmax):    0.66 cm AV Area (Vmean):   0.65 cm AV Area (VTI):     0.68 cm AV Vmax:           365.67 cm/s AV Vmean:          263.333 cm/s AV VTI:            0.779 m AV Peak Grad:      53.5 mmHg AV Mean Grad:      30.7 mmHg LVOT Vmax:         95.10 cm/s LVOT Vmean:        66.850 cm/s LVOT VTI:          0.208 m LVOT/AV VTI ratio: 0.27  AORTA Ao Root diam: 3.20 cm Ao Asc diam:  2.80 cm  MITRAL VALVE               TRICUSPID VALVE MV Area (PHT): 3.19 cm    TR Peak grad:   26.4 mmHg MV Decel Time: 238 msec    TR Vmax:        257.00 cm/s MV E velocity: 84.00 cm/s MV A velocity: 93.40 cm/s  SHUNTS MV E/A ratio:  0.90        Systemic VTI:  0.21 m                            Systemic Diam: 1.80 cm Epifanio Lesches MD Electronically signed by Epifanio Lesches MD Signature Date/Time:  01/22/2021/10:49:19 PM    Final     Assessment and Plan:   #Moderate Aortic Stenosis -She is currently admitted to the hospital with multifocal pneumonia.  There is no evidence of congestive heart failure.  BNP normal on admission with a value of 69.  At bedtime troponin negative x2 with value of 5 and 6 on repeat.  CT PE study reviewed which demonstrates no evidence of coronary calcium but she does have aortic valve calcium. -Echocardiogram was obtained which I personally reviewed.  This does demonstrate a V-max of 3.7 m/s with mean gradient 32 mmHg.  The valve is heavily calcified but opens well. -On my review her LVOT VTI is roughly 21 cm which equates to a dimensionless index of 0.28. -Suspect her stroke-volume index is artificially low in the report due to the fact that her LVOT diameter was measured incorrectly.  Given her body surface area do not believe it is 1.8 cm. -Her cardiovascular examination is inconsistent with severe arctic stenosis.  She has a clear S2 that is present.  Her murmur is really not that impressive or concerning for severe arctic stenosis. -Overall, I suspect she has moderate aortic stenosis.  The valve opens well.  Her exam is inconsistent with severe AS.  Her gradients could be artificially high in the setting of critical illness.  She is currently admitted with multifocal pneumonia.  Regardless I do not believe her aortic valve is an issue for her currently.  We can plan to repeat an echocardiogram when she is more stable versus consideration for aortic valve calcium score to further delineate the severity of her aortic stenosis.  Again I do not suspect this is severe arctic stenosis clinically or by review of her echocardiogram. -I do not believe congestive heart failure is an issue here.  BNP is negative.  She is clinically euvolemic.  We will continue with treatment of her pneumonia. -I will arrange for her to see me in the outpatient setting in 3 to 4 months.  I  believe it will take time for her to recover. -Cardiology will be available as needed.  [facility] will sign off.   Medication Recommendations: None. Other recommendations (labs, testing, etc): None. Follow up as an outpatient: We will arrange outpatient follow-up with me in 3 to 4 months.   For questions or updates, please contact [facility] Please consult [email or web address] for contact info under   Signed, Gerri Spore T. Flora Lipps, MD, [facility] [place]  [facility]  01/23/2021 9:42 AM

## 2021-01-24 ENCOUNTER — Inpatient Hospital Stay (HOSPITAL_COMMUNITY): Payer: Medicare Other

## 2021-01-24 DIAGNOSIS — J9601 Acute respiratory failure with hypoxia: Secondary | ICD-10-CM | POA: Diagnosis not present

## 2021-01-24 DIAGNOSIS — J189 Pneumonia, unspecified organism: Secondary | ICD-10-CM | POA: Diagnosis not present

## 2021-01-24 LAB — BASIC METABOLIC PANEL
Anion gap: 8 (ref 5–15)
BUN: 30 mg/dL — ABNORMAL HIGH (ref 8–23)
CO2: 23 mmol/L (ref 22–32)
Calcium: 8.5 mg/dL — ABNORMAL LOW (ref 8.9–10.3)
Chloride: 106 mmol/L (ref 98–111)
Creatinine, Ser: 0.6 mg/dL (ref 0.44–1.00)
GFR, Estimated: >60 mL/min (ref >60)
Glucose, Bld: 109 mg/dL — ABNORMAL HIGH (ref 70–99)
Potassium: 4.6 mmol/L (ref 3.5–5.1)
Sodium: 137 mmol/L (ref 135–145)

## 2021-01-24 LAB — CBC
HCT: 35.8 % — ABNORMAL LOW (ref 36.0–46.0)
Hemoglobin: 12 g/dL (ref 12.0–15.0)
MCH: 31.4 pg (ref 26.0–34.0)
MCHC: 33.5 g/dL (ref 30.0–36.0)
MCV: 93.7 fL (ref 80.0–100.0)
Platelets: 174 10*3/uL (ref 150–400)
RBC: 3.82 MIL/uL — ABNORMAL LOW (ref 3.87–5.11)
RDW: 14.3 % (ref 11.5–15.5)
WBC: 11.6 10*3/uL — ABNORMAL HIGH (ref 4.0–10.5)
nRBC: 0 % (ref 0.0–0.2)

## 2021-01-24 LAB — BLOOD GAS, ARTERIAL
Acid-base deficit: 1.2 mmol/L (ref 0.0–2.0)
Bicarbonate: 21.6 mmol/L (ref 20.0–28.0)
FIO2: 70
O2 Saturation: 91.6 %
Patient temperature: 97.5
pCO2 arterial: 30.9 mmHg — ABNORMAL LOW (ref 32.0–48.0)
pH, Arterial: 7.456 — ABNORMAL HIGH (ref 7.350–7.450)
pO2, Arterial: 60.7 mmHg — ABNORMAL LOW (ref 83.0–108.0)

## 2021-01-24 LAB — GLUCOSE, CAPILLARY
Glucose-Capillary: 86 mg/dL (ref 70–99)
Glucose-Capillary: 246 mg/dL — ABNORMAL HIGH (ref 70–99)
Glucose-Capillary: 184 mg/dL — ABNORMAL HIGH (ref 70–99)
Glucose-Capillary: 147 mg/dL — ABNORMAL HIGH (ref 70–99)

## 2021-01-24 MED ORDER — HALOPERIDOL LACTATE 5 MG/ML IJ SOLN
4.0000 mg | Freq: Two times a day (BID) | INTRAMUSCULAR | Status: DC | PRN
  Administered 2021-01-24: 4 mg via INTRAVENOUS
  Filled 2021-01-24: qty 1

## 2021-01-24 NOTE — Progress Notes (Signed)
NAME:  Christina Higgins, MRN:  [id number], DOB:  1950-10-10, LOS: 7 ADMISSION DATE:  01/16/2021, CONSULTATION DATE:  01/24/2021  REFERRING MD:  Blake Divine, CHIEF COMPLAINT: Hypoxia, cough  History of Present Illness:  70 year old never smoker, immunocompromised patient on Imuran and Humira injections , presented with cough, shortness of breath and failure of outpatient antibiotics over 3 weeks, chest x-ray showing bilateral interstitial alveolar infiltrates. She is vaccinated and boosted, keeps her grandkids aged 43 and 23, had COVID infection around October 10 , then a week later developed sinusitis type symptoms, treated with amoxicillin initially then doxycycline, ED visit on 11/15 , chest x-ray showing right more than left multifocal infiltrates, given Z-Pak Readmitted on 11/17 for worsening dyspnea and bilateral infiltrates. 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 Sherryll Burger  [facility] Events: Including procedures, antibiotic start and stop dates in addition to other pertinent events   11/19 worsening hypoxia requiring transfer to ICU 11/20 [facility] 11/23 echo moderate to severe AS, AVA 0.6 cm2 11/24 cardiology consultation  Interim History / Subjective:   Remains critically ill, on heated high flow nasal cannula with nonrebreather. Flow was increased to 80 L overnight. Some confusion early morning Husband at bedside. Afebrile  Objective   Blood pressure 92/74, pulse 88, temperature 98.1 F (36.7 C), temperature source Axillary, resp. rate (!) 23, height 5' (1.524 m), weight 65.3 kg, SpO2 93 %.    FiO2 (%):  [60 %-80 %] 80 %   Intake/Output Summary (Last 24 hours)  at 01/24/2021 0926 Last data filed at 01/24/2021 0400 Gross per 24 hour  Intake 214.37 ml  Output 800 ml  Net -585.63 ml    Filed Weights   01/16/21 0911 01/16/21 1701 01/18/21 1849  Weight: 62.6 kg 63.2 kg 65.3 kg    Examination: Gen. Pleasant, acutely ill-appearing, on heated high flow nasal cannula and nonrebreather Neck: no JVD, no LAD Lungs: No accessory muscle use, bibasilar dry crackles, Cardiovascular: RRR, S1-S2 regular, ESM 2/6 at base no edema Abdomen: soft and non-tender, BS hypoactive. Neuro: Awake and alert knows time and date, oriented x3, grossly nonfocal  Labs show normal electrolytes, mild leukocytosis, no anemia  Chest x-ray 11/25 independently reviewed shows persistent bilateral infiltrates but mildly improved aeration at the right base, no effusion Echo reviewed   Resolved Hospital Problem list     Assessment & Plan:   # Acute hypoxemic respiratory failure # ARDS Differential diagnosis of bilateral infiltrates in this immunocompromised host on Imuran is includes post COVID pneumonitis/organizing PNA and flareup of underlying CT/ILD .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 -Respiratory viral panel was negative, as was sputum PJP -- Completed 7 days of cefepime -continue solumedrol 80 mg IV daily  -Hold Imuran -Check serology  ICU delirium -use Haldol as needed, but more important would be frequent reorientation -Encourage passive mobilization in bed  Severe AS -low BNP, no evidence of overt heart failure, appreciate cardiology input -Outpatient follow-up in 3 to 4 months recommended  Husband updated at bedside  Best Practice (right click and "Reselect all SmartList Selections" daily)   Diet/type: Regular consistency (see orders) DVT prophylaxis: LMWH GI prophylaxis: N/A Lines: N/A Foley:  N/A Code  Status:  full code Last date of multidisciplinary goals of care discussion [AFter extensive discussion 11/21  with son, husband, and pt she remains full code]    My independent critical care time x 20 m , including periodic reassessment and coordinating care with team members including respiratory therapy  Ludy Messamore V. Vassie Loll MD [facility] Pulmonary/Critical Care   If no response to pager , please call [phone] until 7 pm After 7:00 pm call Elink  [phone]   01/24/2021

## 2021-01-24 NOTE — Progress Notes (Addendum)
eLink Physician-Brief Progress Note Patient Name: Christina Higgins DOB: December 31, 1950 MRN: [id number]   Date of Service  01/24/2021  HPI/Events of Note  Notified that patient is a little confused. Has been on HFNC and a NRB mask. O2 sat is 90. No distress on camera, using bedpan  eICU Interventions  Check ABG     Intervention Category Major Interventions: Respiratory failure - evaluation and management  [name] 01/24/2021, 4:44 AM  Addendum at 645 am ABG noted No distress on camera O2 sat is 81 and she is talking to RN and alert, awake Follow CXR

## 2021-01-25 DIAGNOSIS — J9621 Acute and chronic respiratory failure with hypoxia: Secondary | ICD-10-CM | POA: Diagnosis present

## 2021-01-25 DIAGNOSIS — J189 Pneumonia, unspecified organism: Secondary | ICD-10-CM | POA: Diagnosis not present

## 2021-01-25 DIAGNOSIS — J9600 Acute respiratory failure, unspecified whether with hypoxia or hypercapnia: Secondary | ICD-10-CM | POA: Diagnosis present

## 2021-01-25 DIAGNOSIS — R6510 Systemic inflammatory response syndrome (SIRS) of non-infectious origin without acute organ dysfunction: Secondary | ICD-10-CM

## 2021-01-25 DIAGNOSIS — J9601 Acute respiratory failure with hypoxia: Secondary | ICD-10-CM | POA: Diagnosis not present

## 2021-01-25 LAB — GLUCOSE, CAPILLARY
Glucose-Capillary: 114 mg/dL — ABNORMAL HIGH (ref 70–99)
Glucose-Capillary: 157 mg/dL — ABNORMAL HIGH (ref 70–99)
Glucose-Capillary: 166 mg/dL — ABNORMAL HIGH (ref 70–99)
Glucose-Capillary: 134 mg/dL — ABNORMAL HIGH (ref 70–99)

## 2021-01-25 LAB — CBC
HCT: 35.2 % — ABNORMAL LOW (ref 36.0–46.0)
Hemoglobin: 11.9 g/dL — ABNORMAL LOW (ref 12.0–15.0)
MCH: 31.6 pg (ref 26.0–34.0)
MCHC: 33.8 g/dL (ref 30.0–36.0)
MCV: 93.6 fL (ref 80.0–100.0)
Platelets: 172 10*3/uL (ref 150–400)
RBC: 3.76 MIL/uL — ABNORMAL LOW (ref 3.87–5.11)
RDW: 14.2 % (ref 11.5–15.5)
WBC: 12.5 10*3/uL — ABNORMAL HIGH (ref 4.0–10.5)
nRBC: 0 % (ref 0.0–0.2)

## 2021-01-25 LAB — BASIC METABOLIC PANEL
Anion gap: 9 (ref 5–15)
BUN: 28 mg/dL — ABNORMAL HIGH (ref 8–23)
CO2: 24 mmol/L (ref 22–32)
Calcium: 8.3 mg/dL — ABNORMAL LOW (ref 8.9–10.3)
Chloride: 102 mmol/L (ref 98–111)
Creatinine, Ser: 0.45 mg/dL (ref 0.44–1.00)
GFR, Estimated: >60 mL/min (ref >60)
Glucose, Bld: 132 mg/dL — ABNORMAL HIGH (ref 70–99)
Potassium: 3.9 mmol/L (ref 3.5–5.1)
Sodium: 135 mmol/L (ref 135–145)

## 2021-01-25 NOTE — Progress Notes (Signed)
NAME:  Christina Higgins, MRN:  [id number], DOB:  09/22/1950, LOS: 8 ADMISSION DATE:  01/16/2021, CONSULTATION DATE:  01/25/2021  REFERRING MD:  Blake Divine, CHIEF COMPLAINT: Hypoxia, cough  History of Present Illness:  70 year old never smoker, immunocompromised patient on Imuran and Humira injections , presented with cough, shortness of breath and failure of outpatient antibiotics over 3 weeks, chest x-ray showing bilateral interstitial alveolar infiltrates. She is vaccinated and boosted, keeps her grandkids aged 29 and 17, had COVID infection around October 10 , then a week later developed sinusitis type symptoms, treated with amoxicillin initially then doxycycline, ED visit on 11/15 , chest x-ray showing right more than left multifocal infiltrates, given Z-Pak Readmitted on 11/17 for worsening dyspnea and bilateral infiltrates. 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 Sherryll Burger  [facility] Events: Including procedures, antibiotic start and stop dates in addition to other pertinent events   11/19 worsening hypoxia requiring transfer to ICU 11/20 [facility] 11/23 echo moderate to severe AS, AVA 0.6 cm2 11/24 cardiology consultation   Scheduled Meds:  acyclovir  800 mg Oral Daily   atorvastatin  40 mg Oral Daily   chlorhexidine  15 mL Mouth Rinse BID   Chlorhexidine Gluconate Cloth  6 each Topical Daily   enoxaparin (LOVENOX) injection  40 mg Subcutaneous Q24H   FLUoxetine  10 mg Oral q morning   fluticasone  1 spray Each Nare Daily   insulin aspart  0-9 Units Subcutaneous TID WC   ipratropium-albuterol  3 mL Nebulization TID   latanoprost  1 drop Both Eyes QHS   mouth rinse   15 mL Mouth Rinse q12n4p   melatonin  3 mg Oral QHS   methylPREDNISolone (SOLU-MEDROL) injection  80 mg Intravenous Daily   multivitamin with minerals  1 tablet Oral Daily   omega-3 acid ethyl esters  1 g Oral Daily   timolol  1 drop Left Eye BID   Continuous Infusions:  sodium chloride Stopped (01/16/21 1522)   sodium chloride Stopped (01/16/21 1522)   PRN Meds:.sodium chloride, sodium chloride, acetaminophen **OR** acetaminophen, diphenhydrAMINE, guaiFENesin-dextromethorphan, haloperidol lactate, lip balm, loratadine, ondansetron **OR** ondansetron (ZOFRAN) IV, senna-docusate, sodium chloride   Interim History / Subjective:  Comfortable on high flow 02 if holds still, some dry cough with deep breath     Objective   Blood pressure (!) 117/57, pulse 79, temperature 97.6 F (36.4 C), temperature source Axillary, resp. rate (!) 25, height 5' (1.524 m), weight 65.3 kg, SpO2 94 %.    FiO2 (%):  [70 %-100 %] 80 %   Intake/Output Summary (Last 24 hours) at 01/25/2021 0843 Last data filed at 01/24/2021 1800 Gross per 24 hour  Intake --  Output 0 ml  Net 0 ml   Filed Weights   01/16/21 0911 01/16/21 1701 01/18/21 1849  Weight: 62.6 kg 63.2 kg 65.3 kg    Examination: General appearance:    acute and chronically ill appearing elderly thin wf lying back tat 45 degrees with mild increased wob   Tmax 98.5  At Rest 02 sats  88% on  "70%"  No jvd Oropharynx clear,  mucosa nl Neck supple Lungs with insp crackles bilaterally RRR no s3 /  2-3/6 SEM  Abd soft with nl  excursion / po intake poor / appetite poor  Extr warm with no edema or clubbing noted Neuro  Sensorium intac,  no apparent motor deficits      Resolved Hospital Problem list     Assessment & Plan:   # Acute hypoxemic respiratory failure # ARDS Differential diagnosis of bilateral infiltrates in this immunocompromised host on Imuran is includes post COVID pneumonitis/organizing PNA and flareup of underlying CT/ILD   CT abdomen/pelvis from 05/2018 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    Best Practice (right click and "Reselect all SmartList Selections" daily)   Diet/type: Regular consistency (see orders) DVT prophylaxis: LMWH GI prophylaxis: N/A Lines: N/A Foley:  N/A Code Status:  full code Last date of multidisciplinary goals of care discussion [AFter extensive discussion 11/21 with son, husband, and pt she remains full code]     The patient is critically ill with multiple organ systems failure and requires high complexity decision making for assessment and support, frequent evaluation and titration of therapies, application of advanced monitoring technologies and extensive interpretation of multiple databases. Critical Care Time devoted to patient care services described in this note is 35 minutes.   Sandrea Hughs, MD Pulmonary and Critical Care Medicine [facility] Cell [phone]   After 7:00 pm call Elink  [phone]

## 2021-01-26 DIAGNOSIS — J189 Pneumonia, unspecified organism: Secondary | ICD-10-CM | POA: Diagnosis not present

## 2021-01-26 DIAGNOSIS — J9601 Acute respiratory failure with hypoxia: Secondary | ICD-10-CM | POA: Diagnosis not present

## 2021-01-26 LAB — CBC
HCT: 36.4 % (ref 36.0–46.0)
Hemoglobin: 12.6 g/dL (ref 12.0–15.0)
MCH: 31.9 pg (ref 26.0–34.0)
MCHC: 34.6 g/dL (ref 30.0–36.0)
MCV: 92.2 fL (ref 80.0–100.0)
Platelets: 175 10*3/uL (ref 150–400)
RBC: 3.95 MIL/uL (ref 3.87–5.11)
RDW: 14.1 % (ref 11.5–15.5)
WBC: 13.5 10*3/uL — ABNORMAL HIGH (ref 4.0–10.5)
nRBC: 0 % (ref 0.0–0.2)

## 2021-01-26 LAB — GLUCOSE, CAPILLARY
Glucose-Capillary: 88 mg/dL (ref 70–99)
Glucose-Capillary: 132 mg/dL — ABNORMAL HIGH (ref 70–99)
Glucose-Capillary: 308 mg/dL — ABNORMAL HIGH (ref 70–99)
Glucose-Capillary: 183 mg/dL — ABNORMAL HIGH (ref 70–99)

## 2021-01-26 LAB — BASIC METABOLIC PANEL
Anion gap: 8 (ref 5–15)
BUN: 27 mg/dL — ABNORMAL HIGH (ref 8–23)
CO2: 24 mmol/L (ref 22–32)
Calcium: 8.5 mg/dL — ABNORMAL LOW (ref 8.9–10.3)
Chloride: 102 mmol/L (ref 98–111)
Creatinine, Ser: 0.57 mg/dL (ref 0.44–1.00)
GFR, Estimated: >60 mL/min (ref >60)
Glucose, Bld: 94 mg/dL (ref 70–99)
Potassium: 4.3 mmol/L (ref 3.5–5.1)
Sodium: 134 mmol/L — ABNORMAL LOW (ref 135–145)

## 2021-01-26 LAB — MAGNESIUM: Magnesium: 2.3 mg/dL (ref 1.7–2.4)

## 2021-01-26 MED ORDER — ENSURE ENLIVE PO LIQD
237.0000 mL | Freq: Three times a day (TID) | ORAL | Status: DC
  Administered 2021-01-26 – 2021-01-28 (×4): 237 mL via ORAL

## 2021-01-26 MED ORDER — ENSURE ENLIVE PO LIQD: 237.0000 mL | Freq: Three times a day (TID) | ORAL | Status: DC

## 2021-01-26 NOTE — Progress Notes (Signed)
NAME:  Christina Higgins, MRN:  [id number], DOB:  1950/06/17, LOS: 9 ADMISSION DATE:  01/16/2021, CONSULTATION DATE:  01/26/2021  REFERRING MD:  Blake Divine, CHIEF COMPLAINT: Hypoxia, cough  History of Present Illness:  70 year old never smoker, immunocompromised patient on Imuran and Humira injections , presented with cough, shortness of breath and failure of outpatient antibiotics over 3 weeks, chest x-ray showing bilateral interstitial alveolar infiltrates. She is vaccinated and boosted, keeps her grandkids aged 47 and 68, had COVID infection around October 10 , then a week later developed sinusitis type symptoms, treated with amoxicillin initially then doxycycline, ED visit on 11/15 , chest x-ray showing right more than left multifocal infiltrates, given Z-Pak Readmitted on 11/17 for worsening dyspnea and bilateral infiltrates. 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 Sherryll Burger  [facility] Events: Including procedures, antibiotic start and stop dates in addition to other pertinent events   11/19 worsening hypoxia requiring transfer to ICU 11/20 [facility] 11/23 echo moderate to severe AS, AVA 0.6 cm2 11/24 cardiology consultation   Scheduled Meds:  acyclovir  800 mg Oral Daily   atorvastatin  40 mg Oral Daily   chlorhexidine  15 mL Mouth Rinse BID   Chlorhexidine Gluconate Cloth  6 each Topical Daily   enoxaparin (LOVENOX) injection  40 mg Subcutaneous Q24H   FLUoxetine  10 mg Oral q morning   fluticasone  1 spray Each Nare Daily   insulin aspart  0-9 Units Subcutaneous TID WC   ipratropium-albuterol  3 mL Nebulization TID   latanoprost  1 drop Both Eyes QHS   mouth rinse   15 mL Mouth Rinse q12n4p   melatonin  3 mg Oral QHS   methylPREDNISolone (SOLU-MEDROL) injection  80 mg Intravenous Daily   multivitamin with minerals  1 tablet Oral Daily   omega-3 acid ethyl esters  1 g Oral Daily   timolol  1 drop Left Eye BID   Continuous Infusions:  sodium chloride Stopped (01/16/21 1522)   sodium chloride Stopped (01/16/21 1522)   PRN Meds:.sodium chloride, sodium chloride, acetaminophen **OR** acetaminophen, diphenhydrAMINE, guaiFENesin-dextromethorphan, haloperidol lactate, lip balm, loratadine, ondansetron **OR** ondansetron (ZOFRAN) IV, senna-docusate, sodium chloride   Interim History / Subjective:  About the same, poor appetite, weak and desats if mask slips down at all  from position    Objective   Blood pressure 110/64, pulse 79, temperature 97.9 F (36.6 C), temperature source Axillary, resp. rate (!) 24, height 5' (1.524 m), weight 65.3 kg, SpO2 94 %.    FiO2 (%):  [70 %] 70 %   Intake/Output Summary (Last 24 hours) at 01/26/2021 0901 Last data filed at 01/26/2021 0160 Gross per 24 hour  Intake --  Output 825 ml  Net -825 ml   Filed Weights   01/16/21 0911 01/16/21 1701 01/18/21 1849  Weight: 62.6 kg 63.2 kg 65.3 kg    Examination:   Tmax  98.4    General appearance:   acute and chronically ill appearing elderly wf mild increased wob  At Rest 02 sats  93% on NRM/ HFN 02   No jvd Oropharynx clear,  mucosa nl Neck supple Lungs with a few scattered exp > insp rhonchi bilaterally RRR no s3  3/6 SEM Abd soft, nl excursion  Extr warm with no edema or clubbing noted Neuro  Sensorium intact ,  no apparent motor deficits        Resolved Hospital Problem list     Assessment & Plan:   # Acute hypoxemic respiratory failure # ARDS Differential diagnosis of bilateral infiltrates in this immunocompromised host on Imuran is includes post COVID pneumonitis/organizing PNA and flareup of underlying CT/ILD  CT abdomen/pelvis from 05/2018  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 -Respiratory viral panel was negative, as was sputum PJP -- Completed 7 days of cefepime >>>  continue solumedrol 80 mg IV daily  -Holding  Imuran >>> next step bipap and avoid intubation if possible as unlikely to come off what is 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     ICU delirium -use Haldol as needed, but more important would be frequent reorientation -Encourage passive mobilization in bed  Severe AS -low BNP, no evidence of overt heart failure,   Cards rec outpatient follow-up in 3 to 4 months   - note desats will be tolerated poorly in setting of limited of CO but no treatment options other than intubate if desats become  refractory which is not a good option as noted above and it well may be EOL discussion will need to be started if not turning around in the next few days  - discussed very limited options with her son 11/26     Best Practice (right click and "Reselect all SmartList Selections" daily)   Diet/type: Regular consistency (see orders) > added supplments 11/27 p meals DVT prophylaxis: LMWH GI prophylaxis: N/A Lines: N/A Foley:  N/A Code Status:  full code Last date of multidisciplinary goals of care discussion [AFter extensive discussion 11/21 with son, husband, and pt she remains full code] - son updated 11/26 - husband uptdated 11/27   The patient is critically ill with multiple organ systems failure and requires high complexity decision making for assessment and support, frequent evaluation and titration of therapies, application of advanced monitoring technologies and extensive interpretation of multiple databases. Critical Care Time devoted to patient care services described in this note is 35 minutes.   Sandrea Hughs, MD Pulmonary and Critical Care Medicine [facility] Cell [phone]   After 7:00 pm call Elink  [phone]

## 2021-01-27 ENCOUNTER — Inpatient Hospital Stay (HOSPITAL_COMMUNITY): Payer: Medicare Other

## 2021-01-27 DIAGNOSIS — R609 Edema, unspecified: Secondary | ICD-10-CM

## 2021-01-27 DIAGNOSIS — J9601 Acute respiratory failure with hypoxia: Secondary | ICD-10-CM | POA: Diagnosis not present

## 2021-01-27 DIAGNOSIS — J189 Pneumonia, unspecified organism: Secondary | ICD-10-CM | POA: Diagnosis not present

## 2021-01-27 LAB — GLUCOSE, CAPILLARY
Glucose-Capillary: 91 mg/dL (ref 70–99)
Glucose-Capillary: 134 mg/dL — ABNORMAL HIGH (ref 70–99)
Glucose-Capillary: 289 mg/dL — ABNORMAL HIGH (ref 70–99)
Glucose-Capillary: 107 mg/dL — ABNORMAL HIGH (ref 70–99)

## 2021-01-27 LAB — SEDIMENTATION RATE: Sed Rate: 24 mm/hr — ABNORMAL HIGH (ref 0–22)

## 2021-01-27 LAB — BRAIN NATRIURETIC PEPTIDE: B Natriuretic Peptide: 21.4 pg/mL (ref 0.0–100.0)

## 2021-01-27 MED ORDER — METHYLPREDNISOLONE SODIUM SUCC 40 MG IJ SOLR
40.0000 mg | Freq: Every day | INTRAMUSCULAR | Status: DC
  Administered 2021-01-27 – 2021-01-28 (×2): 40 mg via INTRAVENOUS
  Filled 2021-01-27 (×2): qty 1

## 2021-01-27 MED ORDER — ENOXAPARIN SODIUM 80 MG/0.8ML IJ SOSY
70.0000 mg | PREFILLED_SYRINGE | Freq: Two times a day (BID) | INTRAMUSCULAR | Status: DC
  Administered 2021-01-27 (×2): 70 mg via SUBCUTANEOUS
  Filled 2021-01-27 (×3): qty 0.7

## 2021-01-27 NOTE — Progress Notes (Signed)
NAME:  Christina Higgins, MRN:  [id number], DOB:  Jul 26, 1950, LOS: 10 ADMISSION DATE:  01/16/2021, CONSULTATION DATE:  01/27/2021  REFERRING MD:  Karleen Hampshire, CHIEF COMPLAINT: Hypoxia, cough  History of Present Illness:  70 year old never smoker, immunocompromised patient on Imuran and Humira injections , presented with cough, shortness of breath and failure of outpatient antibiotics over 3 weeks, chest x-ray showing bilateral interstitial alveolar infiltrates. She is vaccinated and boosted, keeps her grandkids aged 31 and 30, had COVID infection around October 10 , then a week later developed sinusitis type symptoms, treated with amoxicillin initially then doxycycline, ED visit on 11/15 , chest x-ray showing right more than left multifocal infiltrates, given Z-Pak Readmitted on 11/17 for worsening dyspnea and bilateral infiltrates. 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 Manuella Ghazi  [facility] Events: Including procedures, antibiotic start and stop dates in addition to other pertinent events   11/19 worsening hypoxia requiring transfer to ICU 11/20 [facility] 11/23 echo moderate to severe AS, AVA 0.6 cm2 11/24 cardiology consultation   Interim History / Subjective:  Remains weak on high O2. Only pulling 500cc with IS Husband at bedside   Objective   Blood pressure 109/62, pulse 76, temperature 98.8 F (37.1 C), resp. rate (!) 30, height 5' (1.524 m), weight 65.3 kg, SpO2 96 %.    FiO2 (%):  [70 %] 70 %   Intake/Output Summary (Last 24 hours) at 01/27/2021 0744 Last data filed at 01/27/2021 0600 Gross per 24 hour  Intake --  Output 650 ml  Net -650 ml    Filed Weights    01/16/21 0911 01/16/21 1701 01/18/21 1849  Weight: 62.6 kg 63.2 kg 65.3 kg    Examination: Frail elderly lady in NAD NRB and HFNC in place, MM dry Ext warm, no edema +SEM, regular rate Lungs with crackles bases Aox3, fair insight  ESR minimally elevated CXR actually looks a bit better  Resolved Hospital Problem list     Assessment & Plan:   # Acute hypoxemic respiratory failure?ARDS- in context of COVID infection on top of 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  Concerning changes on CT for fibrotic change IE irreversible AIP.  Abx, steroids, diuretics of no benefit. # Physical deconditioning # Question low flow AS- not helping things but she was asymptomatic per husband report prior to [place] and would expect more FTT if this was an issue.  OP f/u recommended by cardiology. # ICU delirium- improved with PRN haldol  Regarding what remaining workup can be done to reveal reversible cause, do not think echo/bubble would reveal something actionable.  Can check LE duplex in case would benefit from full dose AC.  Can also have SLP eval to see if modified consistency diet would help.  She needs to use IS more and work on mobility.    Continue steroids for now.  Reduce dose to $Remov'40mg'FdLUMu$  to reduce delirium risk.  She remains on the brink of needing BIPAP and/or intubation.  I encouraged her and her husband to discuss DNR status as I am not sure underlying lung disease is reversible.    Best Practice (right click and "Reselect all SmartList Selections" daily)   Diet/type: Regular consistency (see orders) >  added supplments 11/27 p meals DVT prophylaxis: LMWH GI prophylaxis: N/A Lines: N/A Foley:  N/A Code Status:  full code Last date of multidisciplinary goals of care discussion [today, will f/u with them tomorrow and consider palliative involvement]   The patient is critically ill with multiple organ systems failure and requires high complexity decision making for assessment  and support, frequent evaluation and titration of therapies, application of advanced monitoring technologies and extensive interpretation of multiple databases. Critical Care Time devoted to patient care services described in this note is 45 minutes.  Erskine Emery MD PCCM

## 2021-01-27 NOTE — Progress Notes (Signed)
SLP Cancellation Note  Patient Details Name: Christina Higgins MRN: [id number] DOB: [birth year]   Cancelled treatment:       Reason Eval/Treat Not Completed: Medical issues which prohibited therapy;Fatigue/lethargy limiting ability to participate. Patient on HFNC and NRB and per nursing is worn out from this mornings interventions. SLP to attempt f/u later this date if patient able.    Angela Nevin, MA, CCC-SLP Speech Therapy

## 2021-01-27 NOTE — Progress Notes (Signed)
Bilateral lower extremity venous duplex has been completed. Preliminary results can be found in CV Proc through chart review.  Results were given to [facility].  01/27/21 9:45 AM Olen Cordial RVT

## 2021-01-27 NOTE — Evaluation (Signed)
Clinical/Bedside Swallow Evaluation Patient Details  Name: Christina Higgins MRN: [id number] Date of Birth: [birth year]  Today's Date: 01/27/2021 Time: SLP Start Time (ACUTE ONLY): 1545 SLP Stop Time (ACUTE ONLY): 1600 SLP Time Calculation (min) (ACUTE ONLY): 15 min  Past Medical History:  Past Medical History:  Diagnosis Date   Arthritis    Diabetes mellitus without complication ([place]) [id number]   Glaucoma    Hyperlipidemia    Past Surgical History:  Past Surgical History:  Procedure Laterality Date   TUBAL LIGATION     HPI:  Patient is a 70 y.o. female with PMH: DM, arthritis, HLD, uveitis who presented to ED on 11/17 with reports of cough and SOB for three weeks. Patient reported having COVID infection in the first week of October which led to persistent symptoms of cough, SOB, cold, congestion. CXR was completed showing PNA and she was sent to ED for further evaluation. On arrival in ED she had a low grade fever of 100.3, had tachypnea, was tachycardic and hypotensive. CT angiogram of the chest revealed Bilateral patchy ground-glass opacities, right greater than left, concerning for multifocal infectious / inflammatory process. She had worsening hypoxia on 11/19 and was transferred to ICU; on 11/20 she was placed on [place]. More recently, she has been on HFNC as well as NRB. ST services was ordered 11/28 to assess swallow function.    Assessment / Plan / Recommendation  Clinical Impression  Patient presents with clinical s/s of dysphagia as per this bedside swallow evaluation. She denied any h/o dysphagia. When SLP entered room, patient was alert, awake and off the NRB (still on HFNC). Oxygen saturations at rest were in range of 90-92% and RR was in range of 20-24. When talking with SLP and during some of PO intake (sips of liquids), RR was observed to increase to 30-35 but this did not stay steadily in this range and would decrease back to baseline. Oxygen saturations did decrease to  88-90% during this evaluation. She exhibited mild intensity dry sounding cough after initial sips of thin liquids (water)  but subsequent sips did not result in any overt s/s aspiration or penetration. SLP discussed with patient and spouse plan to see how she does with her current PO diet and SLP to f/u in next 1-2 dates to determine if any need for objective swallow evaluation. SLP Visit Diagnosis: Dysphagia, unspecified (R13.10)    Aspiration Risk  Mild aspiration risk    Diet Recommendation Regular;Thin liquid   Liquid Administration via: Cup;Straw Medication Administration: Whole meds with liquid Supervision: Patient able to self feed Compensations: Slow rate;Small sips/bites;Other (Comment) (frequent rest breaks, stop eating or drinking if: coughing, RR high, SpO2 low) Postural Changes: Seated upright at 90 degrees    Other  Recommendations Oral Care Recommendations: Oral care BID;Patient independent with oral care    Recommendations for follow up therapy are one component of a multi-disciplinary discharge planning process, led by the attending physician.  Recommendations may be updated based on patient status, additional functional criteria and insurance authorization.  Follow up Recommendations No SLP follow up      Assistance Recommended at Discharge None  Functional Status Assessment Patient has had a recent decline in their functional status and demonstrates the ability to make significant improvements in function in a reasonable and predictable amount of time.  Frequency and Duration min 1 x/week  1 week       Prognosis Prognosis for Safe Diet Advancement: Good      Swallow Study  General Date of Onset: 01/16/21 HPI: Patient is a 70 y.o. female with PMH: DM, arthritis, HLD, uveitis who presented to ED on 11/17 with reports of cough and SOB for three weeks. Patient reported having COVID infection in the first week of October which led to persistent symptoms of cough,  SOB, cold, congestion. CXR was completed showing PNA and she was sent to ED for further evaluation. On arrival in ED she had a low grade fever of 100.3, had tachypnea, was tachycardic and hypotensive. CT angiogram of the chest revealed Bilateral patchy ground-glass opacities, right greater than left, concerning for multifocal infectious / inflammatory process. She had worsening hypoxia on 11/19 and was transferred to ICU; on 11/20 she was placed on HHFNC. More recently, she has been on HFNC as well as NRB. ST services was ordered 11/28 to assess swallow function. Type of Study: Bedside Swallow Evaluation Previous Swallow Assessment: none found Diet Prior to this Study: Regular;Thin liquids Temperature Spikes Noted: Yes (100.2 at 1307 11/28) Respiratory Status: Nasal cannula History of Recent Intubation: No Behavior/Cognition: Alert;Cooperative;Pleasant mood Oral Cavity Assessment: Within Functional Limits Oral Care Completed by SLP: No Oral Cavity - Dentition: Adequate natural dentition Vision: Functional for self-feeding Self-Feeding Abilities: Able to feed self Patient Positioning: Upright in bed Baseline Vocal Quality: Normal Volitional Cough: Weak Volitional Swallow: Able to elicit    Oral/Motor/Sensory Function Overall Oral Motor/Sensory Function: Within functional limits   Ice Chips     Thin Liquid Thin Liquid: Impaired Presentation: Self Fed;Straw Pharyngeal  Phase Impairments: Cough - Immediate;Cough - Delayed;Other (comments) Other Comments: mild intensity dry sounding cough observed following initial few sips of thin liquids. Of note, patient has not had much to eat or drink today    Nectar Thick     Honey Thick     Puree Puree: Not tested   Solid     Solid: Not tested     Angela Nevin, MA, CCC-SLP Speech Therapy

## 2021-01-27 NOTE — Progress Notes (Signed)
ANTICOAGULATION CONSULT NOTE - Initial Consult  Pharmacy Consult for enoxaparin Indication: DVT  No Active Allergies  Patient Measurements: Height: 5' (152.4 cm) Weight: 65.3 kg (143 lb 15.4 oz) IBW/kg (Calculated) : 45.5 Heparin Dosing Weight:   Vital Signs: Temp: 99.5 F (37.5 C) (11/28 0900) Temp Source: Bladder (11/28 0900) BP: 145/84 (11/28 0900) Pulse Rate: 94 (11/28 0900)  Labs: Recent Labs    01/25/21 0715 01/26/21 0720  HGB 11.9* 12.6  HCT 35.2* 36.4  PLT 172 175  CREATININE 0.45 0.57    Estimated Creatinine Clearance: 55.9 mL/min (by C-G formula based on SCr of 0.57 mg/dL).   Medical History: Past Medical History:  Diagnosis Date   Arthritis    Diabetes mellitus without complication (HCC) 07/24/2019   Glaucoma    Hyperlipidemia     Medications:  Scheduled:   acyclovir  800 mg Oral Daily   atorvastatin  40 mg Oral Daily   chlorhexidine  15 mL Mouth Rinse BID   Chlorhexidine Gluconate Cloth  6 each Topical Daily   enoxaparin (LOVENOX) injection  70 mg Subcutaneous Q12H   feeding supplement  237 mL Oral TID BM   FLUoxetine  10 mg Oral q morning   fluticasone  1 spray Each Nare Daily   insulin aspart  0-9 Units Subcutaneous TID WC   ipratropium-albuterol  3 mL Nebulization TID   latanoprost  1 drop Both Eyes QHS   mouth rinse  15 mL Mouth Rinse q12n4p   melatonin  3 mg Oral QHS   methylPREDNISolone (SOLU-MEDROL) injection  40 mg Intravenous Daily   multivitamin with minerals  1 tablet Oral Daily   omega-3 acid ethyl esters  1 g Oral Daily   timolol  1 drop Left Eye BID    Assessment: Pharmacy is consulted to dose enoxaparin treatment dose for 72 tyo female diagnosed with DVT. Lower left extremity shows findings consistent with acute deep vein thrombosis involving the left popliteal vein, left posterior tibial veins, and left peroneal veins.  Today, 01/27/21 Scr 0.57 mg/dl, CrCl 56 ml/min Hgb 31.1, plt 175 Pt listed weight is 65.3 kg Pt not  on anticoagulation PTA. Was on enoxaparin 40 mg QHS while inpatient    Goal of Therapy:  Anti-Xa level 0.6-1 units/ml 4hrs after LMWH dose given Monitor platelets by anticoagulation protocol: Yes   Plan:  Start enoxaparin 70 mg SQ q12h  Monitor CBC, renal function Monitor for signs and symptoms of bleeding  Adalberto Cole, PharmD, BCPS 01/27/2021 10:30 AM

## 2021-01-28 ENCOUNTER — Other Ambulatory Visit (HOSPITAL_COMMUNITY): Payer: Self-pay

## 2021-01-28 ENCOUNTER — Inpatient Hospital Stay
Admission: AD | Admit: 2021-01-28 | Discharge: 2021-04-02 | Disposition: E | Payer: Medicare Other | Source: Other Acute Inpatient Hospital | Attending: Internal Medicine | Admitting: Internal Medicine

## 2021-01-28 DIAGNOSIS — J80 Acute respiratory distress syndrome: Secondary | ICD-10-CM

## 2021-01-28 DIAGNOSIS — R0902 Hypoxemia: Secondary | ICD-10-CM

## 2021-01-28 DIAGNOSIS — N329 Bladder disorder, unspecified: Secondary | ICD-10-CM

## 2021-01-28 DIAGNOSIS — I509 Heart failure, unspecified: Secondary | ICD-10-CM

## 2021-01-28 DIAGNOSIS — J9690 Respiratory failure, unspecified, unspecified whether with hypoxia or hypercapnia: Secondary | ICD-10-CM

## 2021-01-28 DIAGNOSIS — E041 Nontoxic single thyroid nodule: Secondary | ICD-10-CM

## 2021-01-28 DIAGNOSIS — J188 Other pneumonia, unspecified organism: Secondary | ICD-10-CM

## 2021-01-28 DIAGNOSIS — U071 COVID-19: Secondary | ICD-10-CM

## 2021-01-28 DIAGNOSIS — J189 Pneumonia, unspecified organism: Secondary | ICD-10-CM | POA: Diagnosis not present

## 2021-01-28 DIAGNOSIS — J9601 Acute respiratory failure with hypoxia: Secondary | ICD-10-CM | POA: Diagnosis not present

## 2021-01-28 DIAGNOSIS — J9621 Acute and chronic respiratory failure with hypoxia: Secondary | ICD-10-CM

## 2021-01-28 DIAGNOSIS — J90 Pleural effusion, not elsewhere classified: Secondary | ICD-10-CM

## 2021-01-28 LAB — CBC WITH DIFFERENTIAL/PLATELET
Abs Immature Granulocytes: 0.16 10*3/uL — ABNORMAL HIGH (ref 0.00–0.07)
Basophils Absolute: 0 10*3/uL (ref 0.0–0.1)
Basophils Relative: 0 %
Eosinophils Absolute: 0.3 10*3/uL (ref 0.0–0.5)
Eosinophils Relative: 3 %
HCT: 39.1 % (ref 36.0–46.0)
Hemoglobin: 13 g/dL (ref 12.0–15.0)
Immature Granulocytes: 2 %
Lymphocytes Relative: 12 %
Lymphs Abs: 1.3 10*3/uL (ref 0.7–4.0)
MCH: 31 pg (ref 26.0–34.0)
MCHC: 33.2 g/dL (ref 30.0–36.0)
MCV: 93.3 fL (ref 80.0–100.0)
Monocytes Absolute: 0.5 10*3/uL (ref 0.1–1.0)
Monocytes Relative: 5 %
Neutro Abs: 8.5 10*3/uL — ABNORMAL HIGH (ref 1.7–7.7)
Neutrophils Relative %: 78 %
Platelets: 166 10*3/uL (ref 150–400)
RBC: 4.19 MIL/uL (ref 3.87–5.11)
RDW: 14.1 % (ref 11.5–15.5)
WBC: 10.8 10*3/uL — ABNORMAL HIGH (ref 4.0–10.5)
nRBC: 0 % (ref 0.0–0.2)

## 2021-01-28 LAB — COMPREHENSIVE METABOLIC PANEL
ALT: 49 U/L — ABNORMAL HIGH (ref 0–44)
AST: 33 U/L (ref 15–41)
Albumin: 2.5 g/dL — ABNORMAL LOW (ref 3.5–5.0)
Alkaline Phosphatase: 80 U/L (ref 38–126)
Anion gap: 8 (ref 5–15)
BUN: 30 mg/dL — ABNORMAL HIGH (ref 8–23)
CO2: 25 mmol/L (ref 22–32)
Calcium: 8.4 mg/dL — ABNORMAL LOW (ref 8.9–10.3)
Chloride: 102 mmol/L (ref 98–111)
Creatinine, Ser: 0.61 mg/dL (ref 0.44–1.00)
GFR, Estimated: >60 mL/min (ref >60)
Glucose, Bld: 131 mg/dL — ABNORMAL HIGH (ref 70–99)
Potassium: 4.3 mmol/L (ref 3.5–5.1)
Sodium: 135 mmol/L (ref 135–145)
Total Bilirubin: 1 mg/dL (ref 0.3–1.2)
Total Protein: 5.7 g/dL — ABNORMAL LOW (ref 6.5–8.1)

## 2021-01-28 LAB — GLUCOSE, CAPILLARY
Glucose-Capillary: 87 mg/dL (ref 70–99)
Glucose-Capillary: 159 mg/dL — ABNORMAL HIGH (ref 70–99)

## 2021-01-28 MED ORDER — ONDANSETRON HCL 4 MG PO TABS: 4.0000 mg | ORAL_TABLET | Freq: Four times a day (QID) | ORAL | 0 refills | Status: AC | PRN

## 2021-01-28 MED ORDER — ENSURE ENLIVE PO LIQD: 237.0000 mL | Freq: Three times a day (TID) | ORAL | 12 refills | Status: AC

## 2021-01-28 MED ORDER — LORATADINE 10 MG PO TABS: 10.0000 mg | ORAL_TABLET | Freq: Every day | ORAL | Status: AC | PRN

## 2021-01-28 MED ORDER — CHLORHEXIDINE GLUCONATE CLOTH 2 % EX PADS: 6.0000 | MEDICATED_PAD | Freq: Every day | CUTANEOUS | Status: AC

## 2021-01-28 MED ORDER — METHYLPREDNISOLONE SODIUM SUCC 40 MG IJ SOLR
40.0000 mg | Freq: Every day | INTRAMUSCULAR | 0 refills | Status: AC
Start: 2021-01-29 — End: ?

## 2021-01-28 MED ORDER — GUAIFENESIN-DM 100-10 MG/5ML PO SYRP: 10.0000 mL | ORAL_SOLUTION | ORAL | 0 refills | Status: AC | PRN

## 2021-01-28 MED ORDER — APIXABAN 5 MG PO TABS: 5.0000 mg | ORAL_TABLET | Freq: Two times a day (BID) | ORAL | Status: DC

## 2021-01-28 MED ORDER — MELATONIN 3 MG PO TABS: 3.0000 mg | ORAL_TABLET | Freq: Every day | ORAL | 0 refills | Status: AC

## 2021-01-28 MED ORDER — SODIUM CHLORIDE 0.9 % IV SOLN: 10.0000 mL | INTRAVENOUS | 0 refills | Status: AC | PRN

## 2021-01-28 MED ORDER — ORAL CARE MOUTH RINSE: 15.0000 mL | Freq: Two times a day (BID) | OROMUCOSAL | 0 refills | Status: AC

## 2021-01-28 MED ORDER — MIDODRINE HCL 5 MG PO TABS
5.0000 mg | ORAL_TABLET | Freq: Three times a day (TID) | ORAL | Status: DC
  Administered 2021-01-28: 5 mg via ORAL

## 2021-01-28 MED ORDER — ACETAMINOPHEN 325 MG PO TABS: 650.0000 mg | ORAL_TABLET | Freq: Four times a day (QID) | ORAL | Status: AC | PRN

## 2021-01-28 MED ORDER — SENNOSIDES-DOCUSATE SODIUM 8.6-50 MG PO TABS: 1.0000 | ORAL_TABLET | Freq: Every evening | ORAL | Status: AC | PRN

## 2021-01-28 MED ORDER — APIXABAN 5 MG PO TABS
10.0000 mg | ORAL_TABLET | Freq: Two times a day (BID) | ORAL | Status: DC
  Administered 2021-01-28: 10 mg via ORAL

## 2021-01-28 MED ORDER — MIDODRINE HCL 5 MG PO TABS
ORAL_TABLET | ORAL | Status: AC
  Filled 2021-01-28: qty 1

## 2021-01-28 MED ORDER — LATANOPROST 0.005 % OP SOLN: 1.0000 [drp] | Freq: Every day | OPHTHALMIC | 12 refills | Status: AC

## 2021-01-28 MED ORDER — ADULT MULTIVITAMIN W/MINERALS CH: 1.0000 | ORAL_TABLET | Freq: Every day | ORAL | Status: AC

## 2021-01-28 MED ORDER — DIPHENHYDRAMINE HCL 25 MG PO CAPS: 25.0000 mg | ORAL_CAPSULE | Freq: Four times a day (QID) | ORAL | 0 refills | Status: AC | PRN

## 2021-01-28 MED ORDER — LIP MEDEX EX OINT: TOPICAL_OINTMENT | CUTANEOUS | 0 refills | Status: AC | PRN

## 2021-01-28 MED ORDER — HALOPERIDOL LACTATE 5 MG/ML IJ SOLN: 4.0000 mg | Freq: Two times a day (BID) | INTRAMUSCULAR | Status: AC | PRN

## 2021-01-28 MED ORDER — IPRATROPIUM-ALBUTEROL 0.5-2.5 (3) MG/3ML IN SOLN: 3.0000 mL | Freq: Three times a day (TID) | RESPIRATORY_TRACT | Status: AC

## 2021-01-28 MED ORDER — OMEGA-3-ACID ETHYL ESTERS 1 G PO CAPS: 1.0000 g | ORAL_CAPSULE | Freq: Every day | ORAL | Status: AC

## 2021-01-28 MED ORDER — SALINE SPRAY 0.65 % NA SOLN
1.0000 | NASAL | 0 refills | Status: AC | PRN
Start: 2021-01-28 — End: ?

## 2021-01-28 MED ORDER — INSULIN ASPART 100 UNIT/ML IJ SOLN: 0.0000 [IU] | Freq: Three times a day (TID) | INTRAMUSCULAR | 11 refills | Status: AC

## 2021-01-28 MED ORDER — APIXABAN 5 MG PO TABS: 10.0000 mg | ORAL_TABLET | Freq: Two times a day (BID) | ORAL | Status: AC

## 2021-01-28 MED ORDER — MIDODRINE HCL 5 MG PO TABS: 5.0000 mg | ORAL_TABLET | Freq: Three times a day (TID) | ORAL | Status: AC

## 2021-01-28 MED ORDER — APIXABAN 5 MG PO TABS
ORAL_TABLET | ORAL | Status: AC
  Filled 2021-01-28: qty 2

## 2021-01-28 MED ORDER — FLUTICASONE PROPIONATE 50 MCG/ACT NA SUSP: 1.0000 | Freq: Every day | NASAL | 2 refills | Status: AC

## 2021-01-28 MED ORDER — CHLORHEXIDINE GLUCONATE 0.12 % MT SOLN: 15.0000 mL | Freq: Two times a day (BID) | OROMUCOSAL | 0 refills | Status: AC

## 2021-01-28 NOTE — Progress Notes (Signed)
Speech Language Pathology Treatment: Dysphagia  Patient Details Name: Christina Higgins MRN: [id number] DOB: August 20, 1950 Today's Date: 01/24/2021 Time: 7308-1683 SLP Time Calculation (min) (ACUTE ONLY): 11 min  Assessment / Plan / Recommendation Clinical Impression  Pt doing very well with regular diet, thin liquids. Limited appetite, however RN reports no observed difficulty with thin liquids.  Christina Higgins is on 30L HHFNC, 60% Fi02 - she appears to be coordinating respiration/swallowing cycles well.  There were no s/sx of aspiration during session this am, during which time she drank thin water and ate graham crackers independently.  We reviewed the benefits of taking her time when eating/drinking, allowing rest breaks, and giving breathing the priority over swallowing since both functions share a point of entry. She verbalized understanding.  There are no further SLP needs identified - continue a regular diet and thin liquids.  SLP will sign off.   HPI HPI: Patient is a 70 y.o. female with PMH: DM, arthritis, HLD, uveitis who presented to ED on 11/17 with reports of cough and SOB for three weeks. Patient reported having COVID infection in the first week of October which led to persistent symptoms of cough, SOB, cold, congestion. CXR was completed showing PNA and she was sent to ED for further evaluation. On arrival in ED she had a low grade fever of 100.3, had tachypnea, was tachycardic and hypotensive. CT angiogram of the chest revealed Bilateral patchy ground-glass opacities, right greater than left, concerning for multifocal infectious / inflammatory process. She had worsening hypoxia on 11/19 and was transferred to ICU; on 11/20 she was placed on [place]. More recently, she has been on HFNC as well as NRB. ST services was ordered 11/28 to assess swallow function.      SLP Plan  All goals met      Recommendations for follow up therapy are one component of a multi-disciplinary discharge  planning process, led by the attending physician.  Recommendations may be updated based on patient status, additional functional criteria and insurance authorization.    Recommendations  Diet recommendations: Regular;Thin liquid Liquids provided via: Cup;Straw Medication Administration: Whole meds with liquid Supervision: Patient able to self feed Compensations: Slow rate;Small sips/bites;Other (Comment) (prioritize breathing over swallowing; frequent breaks)                Oral Care Recommendations: Oral care BID;Patient independent with oral care Follow Up Recommendations: No SLP follow up Assistance recommended at discharge: None SLP Visit Diagnosis: Dysphagia, unspecified (R13.10) Plan: All goals met                      Christina Higgins Christina Higgins, Elbert CCC/SLP Acute Rehabilitation Services Office number [phone] Pager [phone]  Christina Higgins  01/22/2021, 10:04 AM

## 2021-01-28 NOTE — Progress Notes (Signed)
Report called to Elease Hashimoto RN at [employer], husband at bedside during report, aware and agreeable of the D/C to [facility].

## 2021-01-28 NOTE — TOC Transition Note (Signed)
Transition of Care [facility]) - CM/SW Discharge Note   Patient Details  Name: Christina Higgins MRN: [id number] Date of Birth: Aug 24, 1950  Transition of Care [facility]) CM/SW Contact:  Darleene Cleaver, LCSW Phone Number: 01/08/2021, 10:30 AM   Clinical Narrative:     Patient to be d/c'ed today to [facility] room 5E30.  Patient and family agreeable to plans will transport via [facility] RN to call report to [phone].  Patient's husband is aware of transfer to [facility] today.     Final next level of care: Long Term Acute Care (LTAC) Barriers to Discharge: Barriers Resolved   Patient Goals and CMS Choice Patient states their goals for this hospitalization and ongoing recovery are:: To go to [facility] and then return back home. CMS Medicare.gov Compare Post Acute Care list provided to:: Patient Represenative (must comment) Choice offered to / list presented to : Spouse  Discharge Placement              Patient chooses bed at: Other - please specify in the comment section below: [facility]) Patient to be transferred to facility by: [facility] Name of family member notified: Patient's husband Christina Higgins Patient and family notified of of transfer: 01/20/2021  Discharge Plan and Services   Discharge Planning Services: CM Consult                                 Social Determinants of Health (SDOH) Interventions     Readmission Risk Interventions No flowsheet data found.

## 2021-01-28 NOTE — Progress Notes (Signed)
[facility] at bedside picking pt up, report given to transport RN. Husband at bedside during transfer. VS wnl, IV and foley in place per receiving facility.

## 2021-01-28 NOTE — Discharge Summary (Addendum)
Physician Discharge Summary   Patient ID: Christina Higgins MRN: [id number] DOB/AGE: [birth year] 70 y.o.  Admit date: 01/16/2021 Discharge date: 01/16/2021                     Discharge Plan by Diagnosis   Acute hypoxemic respiratory failure, ? 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  Concerning changes on CT for fibrotic change IE irreversible AIP where abx, steroids, diuretics would be of no benefit. - Continue supplemental O2 as needed to maintain SpO2 > 88-90%. - Continue steroids and wean as tolerated. - Maintain euvolemia to slightly negative if able. - In the event she were to deteriorate, would recommend against intubation as if she were to reach that point, liberation from vent could be quite challenging and would possibly commit her to lifelong tracheostomy / vent dependence.  Generalized physical deconditioning. - Continue ongoing PT efforts. - Encourage mobilization.   Moderate-severe Aortic stenosis: Question low flow AS- seen by cardiology 11/24 who does not suspect this is contributing to her acute symptoms - Cardiology has planned outpatient follow up in a few months.  If she is not discharged from [facility] by then, perhaps she can be seen by cards there.  Asymptomatic hypotension - Per pt/husbands report, baseline blood pressures run low.  - Continue midodrine, maintain MAP>85mmHg.  DVT LLE - No PE notable on CTA.  - Continue Eliquis.  Left thyroid nodule - solid, isoechoic on [place]. - Repeat US in 1 year.   Discharge Summary    Christina Higgins is a 70 year old never smoker, immunocompromised patient on Imuran and Humira injections (hx Uveitis, followed by ophthalmologist Dr. Manuella Ghazi), presented 11/17 with cough, shortness of breath and failure of outpatient antibiotics over 3 weeks, chest x-ray showing bilateral interstitial alveolar infiltrates. She is vaccinated and boosted, keeps her  grandkids aged 59 and 24, had COVID infection around October 10 , then a week later developed sinusitis type symptoms, treated with amoxicillin initially then doxycycline, ED visit on 11/15 , chest x-ray showing right more than left multifocal infiltrates, given Z-Pak. Admitted on 11/17 for worsening dyspnea and bilateral infiltrates. CT angiogram showing bilateral diffuse patchy right more than left multifocal infiltrates.   She required 2 L oxygen on admission but rapidly progressed to requiring 10 L and PCCM consulted 11/19.  She was treated for acute hypoxic respiratory failure with ARDS physiology felt to be 2/2 COVID infection on top of undiagnosed ILD (UIP pattern per imaging back in March 2022).  Echo 11/23 with EF 60-65% and severely calcified AV with concern for severe AS.  Cardiology consulted 11/24 but felt that some of her echo measurements were incorrect and did not feel that she had truly severe AS but rather, moderate.  They recommended considering repeat echo once over acute issues versus consideration for aortic valve calcium score to further delineate the severity of her aortic stenosis.  Follow-up was arranged for 3 to 4 months (Dr. Farris Has). LE duplex 11/28 noteable for LLE DVT.  She is currently on Eliquis.  Due to ongoing high oxygen requirements and the need for ongoing therapy and supportive care, LTAC placement was sought out.  On 11/29, she was deemed medically stable and was cleared for discharge to [facility] Events   11/17 admit. 11/19 PCCM consult. 11/24 Cards consult. 11/29 Discharge to [facility].  Significant Diagnostic Studies  CTA chest 11/17 > no PE.  B/l patchy GGO's R > L, reactive LN's, calcificatoins of AV, left sided thyroid nodule. Thyroid US 11/18 > solid isoechoic nodule.  Repeat US in 1 year. Echo 11/23 > EF 60-65%., calcified AV, ? Severe AS. LE duplex 11/28 > L DVT.  Micro Data  Blood 11/17 > neg. COVID  11/17 > neg. Flu 11/17 > neg. RVP 11/21 > neg.  Objective:  Blood pressure (!) 93/59, pulse 75, temperature 99.7 F (37.6 C), resp. rate (!) 24, height 5' (1.524 m), weight 65.3 kg, SpO2 90 %.    FiO2 (%):  [60 %-70 %] 60 %   Intake/Output Summary (Last 24 hours) at 01/29/2021 1147 Last data filed at 01/27/2021 1559 Gross per 24 hour  Intake --  Output 150 ml  Net -150 ml   Filed Weights   01/16/21 0911 01/16/21 1701 01/18/21 1849  Weight: 62.6 kg 63.2 kg 65.3 kg    Physical Examination: General: Chronically ill appearing, appears deconditioned, in NAD. Neuro: A&O x 3, non-focal.  HEENT: [place]/AT. EOMI, sclerae anicteric. Cardiovascular: RRR, no M/R/G.  Lungs: Respirations even and unlabored.  Fine crackles bilaterally.  ON 30L HHF. Abdomen: BS x 4, soft, NT/ND.  Musculoskeletal: No gross deformities, no edema.  Skin: Intact, warm, no rashes.   Discharge Labs:  BMET Recent Labs  Lab 01/23/21 0628 01/24/21 0240 01/25/21 0715 01/26/21 0720 01/06/2021 0759  NA 134* 137 135 134* 135  K 4.7 4.6 3.9 4.3 4.3  CL 104 106 102 102 102  CO2 21* $Remov'23 24 24 25  'HZNgoZ$ GLUCOSE 90 109* 132* 94 131*  BUN 28* 30* 28* 27* 30*  CREATININE 0.54 0.60 0.45 0.57 0.61  CALCIUM 8.4* 8.5* 8.3* 8.5* 8.4*  MG  --   --   --  2.3  --     CBC Recent Labs  Lab 01/25/21 0715 01/26/21 0720 01/25/2021 0759  HGB 11.9* 12.6 13.0  HCT 35.2* 36.4 39.1  WBC 12.5* 13.5* 10.8*  PLT 172 175 166    Anti-Coagulation No results for input(s): INR in the last 168 hours.      Follow-up Information     O'Neal, Cassie Freer, MD Follow up on 05/21/2021.   Specialties: Cardiology, Internal Medicine, Radiology Why: Cardiology Follow-up on 05/21/2021 at 8:40 AM. Contact information: [place] [address] [phone]                  Allergies as of 01/22/2021   No Active Allergies      Medication List     STOP taking these medications    acyclovir 800 MG  tablet Commonly known as: ZOVIRAX   atorvastatin 40 MG tablet Commonly known as: LIPITOR   azaTHIOprine 50 MG tablet Commonly known as: IMURAN   azithromycin 250 MG tablet Commonly known as: Zithromax Z-Pak   benzonatate 100 MG capsule Commonly known as: TESSALON   cefdinir 300 MG capsule Commonly known as: OMNICEF   chlorpheniramine-HYDROcodone 10-8 MG/5ML Suer Commonly known as: Tussionex Pennkinetic ER   FLUoxetine 10 MG capsule Commonly known as: PROZAC   Humira Pen 40 MG/0.4ML Pnkt Generic drug: Adalimumab   ipratropium 0.03 % nasal spray Commonly known as: ATROVENT   METAMUCIL FIBER PO   OMEGA 3 500 PO   timolol 0.5 % ophthalmic solution Commonly known as: TIMOPTIC       TAKE these medications    acetaminophen 325 MG tablet Commonly known as: TYLENOL Take 2  tablets (650 mg total) by mouth every 6 (six) hours as needed for mild pain (or Fever >/= 101).   apixaban 5 MG Tabs tablet Commonly known as: ELIQUIS Take 2 tablets (10 mg total) by mouth 2 (two) times daily.   chlorhexidine 0.12 % solution Commonly known as: PERIDEX 15 mLs by Mouth Rinse route 2 (two) times daily.   Chlorhexidine Gluconate Cloth 2 % Pads Apply 6 each topically daily. Start taking on: January 29, 2021   diphenhydrAMINE 25 mg capsule Commonly known as: BENADRYL Take 1 capsule (25 mg total) by mouth every 6 (six) hours as needed for allergies.   feeding supplement Liqd Take 237 mLs by mouth 3 (three) times daily between meals.   fluticasone 50 MCG/ACT nasal spray Commonly known as: FLONASE Place 1 spray into both nostrils daily. Start taking on: January 29, 2021   guaiFENesin-dextromethorphan 100-10 MG/5ML syrup Commonly known as: ROBITUSSIN DM Take 10 mLs by mouth every 4 (four) hours as needed for cough.   haloperidol lactate 5 MG/ML injection Commonly known as: HALDOL Inject 0.8 mLs (4 mg total) into the vein every 12 (twelve) hours as needed (for agitated  delirium).   insulin aspart 100 UNIT/ML injection Commonly known as: novoLOG Inject 0-9 Units into the skin 3 (three) times daily with meals.   ipratropium-albuterol 0.5-2.5 (3) MG/3ML Soln Commonly known as: DUONEB Take 3 mLs by nebulization 3 (three) times daily.   latanoprost 0.005 % ophthalmic solution Commonly known as: XALATAN Place 1 drop into both eyes at bedtime.   lip balm ointment Apply topically as needed for lip care.   loratadine 10 MG tablet Commonly known as: CLARITIN Take 1 tablet (10 mg total) by mouth daily as needed for allergies.   melatonin 3 MG Tabs tablet Take 1 tablet (3 mg total) by mouth at bedtime.   methylPREDNISolone sodium succinate 40 mg/mL injection Commonly known as: SOLU-MEDROL Inject 1 mL (40 mg total) into the vein daily. Start taking on: January 29, 2021   midodrine 5 MG tablet Commonly known as: PROAMATINE Take 1 tablet (5 mg total) by mouth 3 (three) times daily with meals.   mouth rinse Liqd solution 15 mLs by Mouth Rinse route 2 times daily at 12 noon and 4 pm.   multivitamin with minerals Tabs tablet Take 1 tablet by mouth daily. Start taking on: January 29, 2021   omega-3 acid ethyl esters 1 g capsule Commonly known as: LOVAZA Take 1 capsule (1 g total) by mouth daily. Start taking on: January 29, 2021   ondansetron 4 MG tablet Commonly known as: ZOFRAN Take 1 tablet (4 mg total) by mouth every 6 (six) hours as needed for nausea.   senna-docusate 8.6-50 MG tablet Commonly known as: Senokot-S Take 1 tablet by mouth at bedtime as needed for mild constipation.   sodium chloride 0.65 % Soln nasal spray Commonly known as: OCEAN Place 1 spray into both nostrils as needed for congestion.   sodium chloride 0.9 % infusion Inject 10 mLs into the vein as needed (For administration of IV meds).         Disposition: [facility].   Discharge Condition:  Christina Higgins has met maximum benefit of  inpatient care and is medically stable and cleared for discharge.  Patient is pending follow up as above.     Time spent on discharge: Greater than 30 minutes.   Montey Hora, [facility] For pager details, please see  AMION If no response to pager, please call [phone] until 7:00 PM After 7:00 PM, please call Elink at [phone] 01/26/2021, 11:47 AM

## 2021-01-28 NOTE — TOC Progression Note (Addendum)
Transition of Care [facility]) - Progression Note    Patient Details  Name: Christina Higgins MRN: [id number] Date of Birth: [birth year]  Transition of Care [facility]) CM/SW Contact  Darleene Cleaver, [place] Phone Number: 02-24-21, 10:14 AM  Clinical Narrative:     CSW was informed that [facility] was made aware of this patient.  CSW spoke to patient's husband to see if he is interested in [facility] and safety inspector.  Per Husband he is interested and would like to find out more information about it.  CSW updated Candise Bowens at [facility] to call the husband and talk to him about it.  Candise Bowens informed CSW that he would like to talk to their son about it first and then let her know.  CSW updated attending physician, that if patient is agreeable, [facility] has a bed available for today.  CSW to continue to follow patient's progress and awaiting decision by husband.  10:20am  CSW received phone call back from patient's husband, they have discussed it, and would like to pursue [facility] placement.  CSW updated [facility] liaison, attending physician, and bedside nurse.   Expected Discharge Plan: Home/Self Care Barriers to Discharge: Continued Medical Work up  Expected Discharge Plan and Services Expected Discharge Plan: Home/Self Care   Discharge Planning Services: CM Consult   Living arrangements for the past 2 months: Single Family Home                                       Social Determinants of Health (SDOH) Interventions    Readmission Risk Interventions No flowsheet data found.

## 2021-01-28 NOTE — Progress Notes (Signed)
   NAME:  Christina Higgins, MRN:  [id number], DOB:  [birth year], LOS: 11 ADMISSION DATE:  01/16/2021, CONSULTATION DATE:  01/11/2021  REFERRING MD:  Blake Divine, CHIEF COMPLAINT: Hypoxia, cough  History of Present Illness:  70 year old never smoker, immunocompromised patient on Imuran and Humira injections , presented with cough, shortness of breath and failure of outpatient antibiotics over 3 weeks, chest x-ray showing bilateral interstitial alveolar infiltrates. She is vaccinated and boosted, keeps her grandkids aged 43 and 35, had COVID infection around October 10 , then a week later developed sinusitis type symptoms, treated with amoxicillin initially then doxycycline, ED visit on 11/15 , chest x-ray showing right more than left multifocal infiltrates, given Z-Pak Readmitted on 11/17 for worsening dyspnea and bilateral infiltrates. 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 Sherryll Burger  [facility] Events: Including procedures, antibiotic start and stop dates in addition to other pertinent events   11/19 worsening hypoxia requiring transfer to ICU 11/20 [facility] 11/23 echo moderate to severe AS, AVA 0.6 cm2 11/24 cardiology consultation   Interim History / Subjective:  Mildly hypotensive overnight. Otherwise, no significant changes. She reports feeling a little better this morning. Remains on HHF @30L   Objective   Blood pressure (!) 87/41, pulse 75, temperature 99 F (37.2 C), resp. rate 20, height 5' (1.524 m), weight 65.3 kg, SpO2 94 %.    FiO2 (%):  [60 %-70 %] 60 %   Intake/Output Summary (Last 24 hours) at 01/01/2021 0736 Last data filed at 01/27/2021 1559 Gross per 24 hour   Intake --  Output 450 ml  Net -450 ml    Filed Weights   01/16/21 0911 01/16/21 1701 01/18/21 1849  Weight: 62.6 kg 63.2 kg 65.3 kg    Examination: Well appearing female in no distress Cardiac: RRR, no LE edema Pulm: breathing comfortably on HHF @30L . Fine inspiratory crackles bilaterally Neuro: a/o x4.  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  Concerning changes on CT for fibrotic change IE irreversible AIP.  Abx, steroids, diuretics of no benefit. # Physical deconditioning -Continue steroid wean as tolerated -Encourage moblilization/IS  #Moderate-severe Aortic stenosis: Question low flow AS- seen by cardiology 11/24 who does not suspect this is contributing to her acute symptoms. Will need outpatient follow up with cardiology #Asymptomatic hypotension. Per pt/husbands report, baseline blood pressures run low.  -start midodrine, maintain MAP>107mmHg  #DVT RLE. No PE notable on CTA.  -Transition to eliquis  Best Practice (right click and "Reselect all SmartList Selections" daily)   Diet/type: Regular consistency (see orders) > added supplments 11/27 p meals DVT prophylaxis: LMWH GI prophylaxis: N/A Lines: N/A Foley:  N/A Code Status:  full code Last date of multidisciplinary goals of care discussion: 11/29   12/27, MD Internal Medicine Resident PGY-3 12/29 Internal Medicine Residency Pager: [phone] 01/17/2021 8:07 AM

## 2021-01-28 NOTE — Progress Notes (Signed)
MD notified of pt's low BPs, no symptoms observed. Spouse at bedside states that pt's BPs are her baseline prior hospital admission. New order obtained for medication.

## 2021-01-28 NOTE — Progress Notes (Signed)
ANTICOAGULATION CONSULT NOTE  Pharmacy Consult for Apixaban Indication: DVT  No Active Allergies  Patient Measurements: Height: 5' (152.4 cm) Weight: 65.3 kg (143 lb 15.4 oz) IBW/kg (Calculated) : 45.5 Heparin Dosing Weight:   Vital Signs: Temp: 99 F (37.2 C) (11/29 0600) BP: 87/41 (11/29 0600) Pulse Rate: 75 (11/29 0600)  Labs: Recent Labs    01/26/21 0720  HGB 12.6  HCT 36.4  PLT 175  CREATININE 0.57     Estimated Creatinine Clearance: 55.9 mL/min (by C-G formula based on SCr of 0.57 mg/dL).   Medical History: Past Medical History:  Diagnosis Date   Arthritis    Diabetes mellitus without complication (HCC) 07/24/2019   Glaucoma    Hyperlipidemia     Medications:  Scheduled:   acyclovir  800 mg Oral Daily   apixaban  10 mg Oral BID   Followed by   Melene Muller ON 02/04/2021] apixaban  5 mg Oral BID   atorvastatin  40 mg Oral Daily   chlorhexidine  15 mL Mouth Rinse BID   Chlorhexidine Gluconate Cloth  6 each Topical Daily   feeding supplement  237 mL Oral TID BM   FLUoxetine  10 mg Oral q morning   fluticasone  1 spray Each Nare Daily   insulin aspart  0-9 Units Subcutaneous TID WC   ipratropium-albuterol  3 mL Nebulization TID   latanoprost  1 drop Both Eyes QHS   mouth rinse  15 mL Mouth Rinse q12n4p   melatonin  3 mg Oral QHS   methylPREDNISolone (SOLU-MEDROL) injection  40 mg Intravenous Daily   midodrine  5 mg Oral TID WC   multivitamin with minerals  1 tablet Oral Daily   omega-3 acid ethyl esters  1 g Oral Daily   timolol  1 drop Left Eye BID    Assessment: Pharmacy is consulted to dose enoxaparin treatment dose for 87 tyo female diagnosed with DVT. Lower left extremity shows findings consistent with acute deep vein thrombosis involving the left popliteal vein, left posterior tibial veins, and left peroneal veins.  Today, 02/13/2021 Transition to Apixaban for LE DVT  Goal of Therapy:  Monitor platelets by anticoagulation protocol: Yes   Plan:   Enoxaparin discontinued Begin Apixaban 10mg  bid x 7 days > 5mg  bid on 12/6 Educated  PharmD [facility] [phone] February 13, 2021, 11:38 AM

## 2021-01-28 NOTE — Discharge Instructions (Addendum)
Information on my medicine - ELIQUIS (apixaban)  This medication education was reviewed with me or my healthcare representative as part of my discharge preparation. The pharmacist that spoke with me during my hospital stay was: Otho Bellows, [facility]  Why was Eliquis prescribed for you? Eliquis was prescribed to treat blood clots that may have been found in the veins of your legs (deep vein thrombosis) or in your lungs (pulmonary embolism) and to reduce the risk of them occurring again.  What do You need to know about Eliquis? The starting dose is 10 mg (two 5 mg tablets) taken TWICE daily for the FIRST SEVEN (7) DAYS, then on 02/04/2021 the dose is reduced to ONE 5 mg tablet taken TWICE daily. Eliquis may be taken with or without food.   Try to take the dose about the same time in the morning and in the evening. If you have difficulty swallowing the tablet whole please discuss with your pharmacist how to take the medication safely.  Take Eliquis exactly as prescribed and DO NOT stop taking Eliquis without talking to the doctor who prescribed the medication. Stopping may increase your risk of developing a new blood clot.  Refill your prescription before you run out.  After discharge, you should have regular check-up appointments with your healthcare provider that is prescribing your Eliquis.    What do you do if you miss a dose? If a dose of ELIQUIS is not taken at the scheduled time, take it as soon as possible on the same day and twice-daily administration should be resumed. The dose should not be doubled to make up for a missed dose.  Important Safety Information A possible side effect of Eliquis is bleeding. You should call your healthcare provider right away if you experience any of the following: Bleeding from an injury or your nose that does not stop. Unusual colored urine (red or dark brown) or unusual colored stools (red or black). Unusual bruising for unknown reasons. A  serious fall or if you hit your head (even if there is no bleeding).  Some medicines may interact with Eliquis and might increase your risk of bleeding or clotting while on Eliquis. To help avoid this, consult your healthcare provider or pharmacist prior to using any new prescription or non-prescription medications, including herbals, vitamins, non-steroidal anti-inflammatory drugs (NSAIDs) and supplements.  This website has more information on Eliquis (apixaban): http://www.eliquis.com/eliquis/home

## 2021-01-29 ENCOUNTER — Other Ambulatory Visit (HOSPITAL_COMMUNITY): Payer: Self-pay

## 2021-01-29 ENCOUNTER — Telehealth: Payer: Self-pay

## 2021-01-29 DIAGNOSIS — J9621 Acute and chronic respiratory failure with hypoxia: Secondary | ICD-10-CM

## 2021-01-29 DIAGNOSIS — J80 Acute respiratory distress syndrome: Secondary | ICD-10-CM

## 2021-01-29 DIAGNOSIS — J189 Pneumonia, unspecified organism: Secondary | ICD-10-CM

## 2021-01-29 DIAGNOSIS — U071 COVID-19: Secondary | ICD-10-CM

## 2021-01-29 LAB — CBC WITH DIFFERENTIAL/PLATELET
Abs Immature Granulocytes: 0.2 10*3/uL — ABNORMAL HIGH (ref 0.00–0.07)
Basophils Absolute: 0 10*3/uL (ref 0.0–0.1)
Basophils Relative: 0 %
Eosinophils Absolute: 0.3 10*3/uL (ref 0.0–0.5)
Eosinophils Relative: 3 %
HCT: 41.7 % (ref 36.0–46.0)
Hemoglobin: 14.2 g/dL (ref 12.0–15.0)
Immature Granulocytes: 2 %
Lymphocytes Relative: 13 %
Lymphs Abs: 1.8 10*3/uL (ref 0.7–4.0)
MCH: 31.6 pg (ref 26.0–34.0)
MCHC: 34.1 g/dL (ref 30.0–36.0)
MCV: 92.7 fL (ref 80.0–100.0)
Monocytes Absolute: 0.6 10*3/uL (ref 0.1–1.0)
Monocytes Relative: 4 %
Neutro Abs: 10.4 10*3/uL — ABNORMAL HIGH (ref 1.7–7.7)
Neutrophils Relative %: 78 %
Platelets: 209 10*3/uL (ref 150–400)
RBC: 4.5 MIL/uL (ref 3.87–5.11)
RDW: 14.1 % (ref 11.5–15.5)
WBC: 13.3 10*3/uL — ABNORMAL HIGH (ref 4.0–10.5)
nRBC: 0 % (ref 0.0–0.2)

## 2021-01-29 LAB — COMPREHENSIVE METABOLIC PANEL
ALT: 46 U/L — ABNORMAL HIGH (ref 0–44)
AST: 26 U/L (ref 15–41)
Albumin: 2.2 g/dL — ABNORMAL LOW (ref 3.5–5.0)
Alkaline Phosphatase: 77 U/L (ref 38–126)
Anion gap: 7 (ref 5–15)
BUN: 26 mg/dL — ABNORMAL HIGH (ref 8–23)
CO2: 27 mmol/L (ref 22–32)
Calcium: 8.5 mg/dL — ABNORMAL LOW (ref 8.9–10.3)
Chloride: 99 mmol/L (ref 98–111)
Creatinine, Ser: 0.51 mg/dL (ref 0.44–1.00)
GFR, Estimated: >60 mL/min (ref >60)
Glucose, Bld: 101 mg/dL — ABNORMAL HIGH (ref 70–99)
Potassium: 4.6 mmol/L (ref 3.5–5.1)
Sodium: 133 mmol/L — ABNORMAL LOW (ref 135–145)
Total Bilirubin: 1 mg/dL (ref 0.3–1.2)
Total Protein: 5.3 g/dL — ABNORMAL LOW (ref 6.5–8.1)

## 2021-01-29 LAB — HEMOGLOBIN A1C
Hgb A1c MFr Bld: 6.6 % — ABNORMAL HIGH (ref 4.8–5.6)
Mean Plasma Glucose: 143 mg/dL

## 2021-01-29 MED ORDER — IOHEXOL 350 MG/ML SOLN
75.0000 mL | Freq: Once | INTRAVENOUS | Status: AC | PRN
  Administered 2021-01-29: 75 mL via INTRAVENOUS

## 2021-01-29 NOTE — Consult Note (Signed)
Pulmonary Critical Care Medicine [facility] [place]  PULMONARY SERVICE  Date of Service: 01/29/2021  PULMONARY CRITICAL CARE CONSULT   [name]  NWG:[id number]  DOB: 1950-07-09   DOA: February 09, 2021  Referring Physician: Luna Kitchens, MD  HPI: Christina Higgins is a 70 y.o. female seen for follow up of Acute on Chronic Respiratory Failure.  Patient has multiple medical problems including diabetes hyperlipidemia chronic arthritis uveitis on Humira who recently had a COVID infection in October the patient afterwards had persistence of cough congestion.  Patient was seen by the primary care physician given antibiotics.  Respiratory status actually continued to deteriorate chest x-ray revealing pneumonitis and she came into the emergency department was found to have a fever of 100.3 along with tachycardia tachypnea.  The patient was admitted to the end placed in the ICU had declining respiratory status ended up on high flow oxygen and her oxygen requirements have remained at 100% and today she is actually on a nonrebreather along with 40 L flow rate.  CT of the chest had been done which showed bilateral groundglass opacities consistent with an ARDS versus post COVID infection.  Review of the notes state that she may have actually also had some undiagnosed interstitial lung disease with UIP with chronic fibrotic changes on the CT.  At the bedside was her husband at a lengthy conversation with him as well as the patient and they are both in agreement that they do not want long-term mechanical ventilation and do not want a tracheostomy.  They both agree that if she were to require intubation they would not want to go more than 2 weeks on mechanical ventilation and if she does not show improvement then the husband and patient states that she would like to be kept comfortable and taken off of mechanical support understanding that this could result in her death.  Review of Systems:   ROS performed and is unremarkable other than noted above.  Past Medical History:  Diagnosis Date   Arthritis    Diabetes mellitus without complication (HCC) 07/24/2019   Glaucoma    Hyperlipidemia     Past Surgical History:  Procedure Laterality Date   TUBAL LIGATION      Social History:    reports that she has never smoked. She has never used smokeless tobacco. She reports that she does not drink alcohol and does not use drugs.  Family History: Non-Contributory to the present illness  No Active Allergies  Medications: Reviewed on Rounds  Physical Exam:  Vitals: Temperature is 97.3 pulse 70 respiratory rate is 25 blood pressure is 94/48 saturations 94%  Ventilator Settings currently is off the ventilator on high flow oxygen 40 L 100% nonrebreather in addition to 100% high flow  General: Comfortable at this time Eyes: Grossly normal lids, irises & conjunctiva ENT: grossly tongue is normal Neck: no obvious mass Cardiovascular: S1-S2 normal no gallop Respiratory: Very coarse breath sounds Abdomen: Soft and nontender Skin: no rash seen on limited exam Musculoskeletal: not rigid Psychiatric:unable to assess Neurologic: no seizure no involuntary movements         Labs on Admission:  Basic Metabolic Panel: Recent Labs  Lab 01/24/21 0240 01/25/21 0715 01/26/21 0720 February 09, 2021 0759 01/29/21 0432  NA 137 135 134* 135 133*  K 4.6 3.9 4.3 4.3 4.6  CL 106 102 102 102 99  CO2 GLUCOSE 109* 132* 94 131* 101*  BUN 30* 28* 27* 30* 26*  CREATININE 0.60  0.45 0.57 0.61 0.51  CALCIUM 8.5* 8.3* 8.5* 8.4* 8.5*  MG  --   --  2.3  --   --     Recent Labs  Lab 01/24/21 0500  PHART 7.456*  PCO2ART 30.9*  PO2ART 60.7*  HCO3 21.6  O2SAT 91.6    Liver Function Tests: Recent Labs  Lab 01/18/2021 0759 01/29/21 0432  AST 33 26  ALT 49* 46*  ALKPHOS 80 77  BILITOT 1.0 1.0  PROT 5.7* 5.3*  ALBUMIN 2.5* 2.2*   No results for input(s): LIPASE, AMYLASE  in the last 168 hours. No results for input(s): AMMONIA in the last 168 hours.  CBC: Recent Labs  Lab 01/24/21 0240 01/25/21 0715 01/26/21 0720 01/27/2021 0759 01/29/21 0432  WBC 11.6* 12.5* 13.5* 10.8* 13.3*  NEUTROABS  --   --   --  8.5* 10.4*  HGB 12.0 11.9* 12.6 13.0 14.2  HCT 35.8* 35.2* 36.4 39.1 41.7  MCV 93.7 93.6 92.2 93.3 92.7  PLT 174 172 175 166 209    Cardiac Enzymes: No results for input(s): CKTOTAL, CKMB, CKMBINDEX, TROPONINI in the last 168 hours.  BNP (last 3 results) Recent Labs    01/16/21 0917 01/23/21 0628 01/27/21 0245  BNP 69.3 40.3 21.4    ProBNP (last 3 results) No results for input(s): PROBNP in the last 8760 hours.   Radiological Exams on Admission: CT CHEST W CONTRAST  Result Date: 01/29/2021 CLINICAL DATA:  Pneumothorax EXAM: CT CHEST WITH CONTRAST TECHNIQUE: Multidetector CT imaging of the chest was performed during intravenous contrast administration. CONTRAST:  70mL OMNIPAQUE IOHEXOL 350 MG/ML SOLN COMPARISON:  01/16/2021 FINDINGS: Cardiovascular: No significant vascular findings. Normal heart size. No pericardial effusion. Mediastinum/Nodes: Small volume pneumomediastinum. No mediastinal or axillary lymphadenopathy. There is subcutaneous emphysema extending into left chest wall. Lungs/Pleura: There is no pneumothorax. There are bilateral basilar predominant opacities superimposed widespread ground-glass opacity. No pleural effusion. The airways are patent. Upper Abdomen: No acute abnormality. Musculoskeletal: No chest wall abnormality. No acute or significant osseous findings. IMPRESSION: 1. Small volume pneumomediastinum and subcutaneous emphysema extending into the left chest wall and lower neck. No pneumothorax. 2. Bilateral basilar predominant opacities superimposed on widespread ground-glass opacity, concerning for multifocal pneumonia. Electronically Signed   By: Deatra Robinson M.D.   On: 01/29/2021 03:53   DG CHEST PORT 1 VIEW  Result  Date: 01/29/2021 CLINICAL DATA:  Left apical pneumothorax. Cough and shortness of breath. EXAM: PORTABLE CHEST 1 VIEW COMPARISON:  With a 11/19/2020.  Chest CT 01/29/2021 FINDINGS: 0614 hours. Trace pleural or extrapleural gas noted left lung apex with evidence of pneumomediastinum and subcutaneous emphysema in the left supraclavicular and shoulder regions. The cardio pericardial silhouette is enlarged. Patchy bilateral airspace disease again noted. The visualized bony structures of the thorax show no acute abnormality. Telemetry leads overlie the chest. IMPRESSION: Trace pleural or extrapleural gas noted left lung apex with evidence of pneumomediastinum and subcutaneous emphysema. Patchy bilateral airspace disease appears mildly progressive since previous chest x-ray. Electronically Signed   By: Kennith Center M.D.   On: 01/29/2021 06:35   DG Chest Port 1 View  Result Date: 12/31/2020 CLINICAL DATA:  Respiratory failure. EXAM: PORTABLE CHEST 1 VIEW COMPARISON:  January 27, 2021. FINDINGS: Stable cardiomediastinal silhouette. Interval development pneumomediastinum and pneumopericardium. Stable bilateral interstitial lung densities are noted concerning for infection. Bony thorax is unremarkable. Possible small left apical pneumothorax is noted. Left-sided subcutaneous emphysema is noted. IMPRESSION: Interval development of pneumomediastinum and pneumopericardium concerning for bronchial or esophageal  injury; CT scan is recommended for further evaluation. Also noted is possible small left apical pneumothorax. These results will be called to the ordering clinician or representative by the Radiologist Assistant, and communication documented in the PACS or zVision Dashboard. Electronically Signed   By: Lupita Raider M.D.   On: 13-Feb-2021 15:03   DG Chest Port 1 View  Result Date: 01/27/2021 CLINICAL DATA:  70 year old female with history of bilateral pneumonia. Follow-up study. EXAM: PORTABLE CHEST 1 VIEW  COMPARISON:  Chest x-ray 01/24/2021. FINDINGS: Lung volumes have improved and are now near normal. There continues to be widespread but patchy areas of interstitial prominence and airspace disease scattered throughout the lungs bilaterally, most evident in the mid to lower lungs, with significantly improved aeration compared to the prior examination, particularly in the right mid lung. No pleural effusions. No pneumothorax. No evidence of pulmonary edema. Heart size is normal. Upper mediastinal contours are within normal limits allowing for patient rotation to the right. IMPRESSION: 1. Resolving patchy asymmetrically distributed interstitial and airspace disease, favored to reflect resolving multilobar bilateral pneumonia. Electronically Signed   By: Trudie Reed M.D.   On: 01/27/2021 08:10   VAS [place] LOWER EXTREMITY VENOUS (DVT)  Result Date: 01/27/2021  Lower Venous DVT Study Patient Name:  ELAINAH RHYNE  Date of Exam:   01/27/2021 Medical Rec #: [id number]            Accession #:    [id number] Date of Birth: 01-20-1951            Patient Gender: F Patient Age:   38 years Exam Location:  [facility] Procedure:      VAS [place] LOWER EXTREMITY VENOUS (DVT) Referring Phys: Levon Hedger --------------------------------------------------------------------------------  Indications: Edema.  Risk Factors: None identified. Comparison Study: No prior studies. Performing Technologist: Chanda Busing RVT  Examination Guidelines: A complete evaluation includes B-mode imaging, spectral Doppler, color Doppler, and power Doppler as needed of all accessible portions of each vessel. Bilateral testing is considered an integral part of a complete examination. Limited examinations for reoccurring indications may be performed as noted. The reflux portion of the exam is performed with the patient in reverse Trendelenburg.  +---------+---------------+---------+-----------+----------+--------------+ RIGHT     CompressibilityPhasicitySpontaneityPropertiesThrombus Aging +---------+---------------+---------+-----------+----------+--------------+ CFV      Full           Yes      Yes                                 +---------+---------------+---------+-----------+----------+--------------+ SFJ      Full                                                        +---------+---------------+---------+-----------+----------+--------------+ FV Prox  Full                                                        +---------+---------------+---------+-----------+----------+--------------+ FV Mid   Full                                                        +---------+---------------+---------+-----------+----------+--------------+  FV DistalFull                                                        +---------+---------------+---------+-----------+----------+--------------+ PFV      Full                                                        +---------+---------------+---------+-----------+----------+--------------+ POP      Full           Yes      Yes                                 +---------+---------------+---------+-----------+----------+--------------+ PTV      Full                                                        +---------+---------------+---------+-----------+----------+--------------+ PERO     Full                                                        +---------+---------------+---------+-----------+----------+--------------+   +---------+---------------+---------+-----------+----------+--------------+ LEFT     CompressibilityPhasicitySpontaneityPropertiesThrombus Aging +---------+---------------+---------+-----------+----------+--------------+ CFV      Full           Yes      Yes                                 +---------+---------------+---------+-----------+----------+--------------+ SFJ      Full                                                         +---------+---------------+---------+-----------+----------+--------------+ FV Prox  Full                                                        +---------+---------------+---------+-----------+----------+--------------+ FV Mid   Full                                                        +---------+---------------+---------+-----------+----------+--------------+ FV DistalFull                                                        +---------+---------------+---------+-----------+----------+--------------+  PFV      Full                                                        +---------+---------------+---------+-----------+----------+--------------+ POP      Partial        Yes      Yes                  Acute          +---------+---------------+---------+-----------+----------+--------------+ PTV      None                                         Acute          +---------+---------------+---------+-----------+----------+--------------+ PERO     None                                         Acute          +---------+---------------+---------+-----------+----------+--------------+     Summary: RIGHT: - There is no evidence of deep vein thrombosis in the lower extremity.  - No cystic structure found in the popliteal fossa.  LEFT: - Findings consistent with acute deep vein thrombosis involving the left popliteal vein, left posterior tibial veins, and left peroneal veins. - No cystic structure found in the popliteal fossa.  *See table(s) above for measurements and observations. Electronically signed by Gerarda Fraction on 01/27/2021 at 10:15:58 AM.    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  I reviewed the chest film appears to be that she was getting worse has been diuresed in the past without really any significant improvement.  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  The patient and her husband do understand that her prognosis is poor we will will continue to do everything however if she ends up on mechanical ventilation they do not want to go more than 2 weeks on the ventilator and they have expressed also that they do not want to be kept alive by mechanical ventilation or to tracheostomy. 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  Plan is going to be to continue to monitor closely she is already been treated with steroids not responding to antibiotics Multifocal pneumonia patient has been treated with oxygen therapy however still not showing any major signs of improvement   I have personally seen and evaluated the patient, evaluated laboratory and imaging results, formulated the assessment and plan and placed orders. The Patient requires high complexity decision making with multiple systems involvement.  Case was discussed on Rounds  with the Respiratory Therapy Director and the Respiratory staff Time Spent  Yevonne Pax, MD [facility] Pulmonary Critical Care Medicine Sleep Medicine

## 2021-01-29 NOTE — Telephone Encounter (Signed)
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

## 2021-01-29 NOTE — Telephone Encounter (Signed)
Patient has been scheduled for hospital f/u. Nothing further needed at this time.   Next Appt With Pulmonology Chilton Greathouse, MD) 02/13/2021 at 2:30 PM

## 2021-01-30 LAB — BASIC METABOLIC PANEL
Anion gap: 7 (ref 5–15)
BUN: 24 mg/dL — ABNORMAL HIGH (ref 8–23)
CO2: 27 mmol/L (ref 22–32)
Calcium: 8.7 mg/dL — ABNORMAL LOW (ref 8.9–10.3)
Chloride: 105 mmol/L (ref 98–111)
Creatinine, Ser: 0.65 mg/dL (ref 0.44–1.00)
GFR, Estimated: >60 mL/min (ref >60)
Glucose, Bld: 99 mg/dL (ref 70–99)
Potassium: 5.5 mmol/L — ABNORMAL HIGH (ref 3.5–5.1)
Sodium: 139 mmol/L (ref 135–145)

## 2021-01-30 LAB — CBC
HCT: 38.2 % (ref 36.0–46.0)
Hemoglobin: 12.4 g/dL (ref 12.0–15.0)
MCH: 30.7 pg (ref 26.0–34.0)
MCHC: 32.5 g/dL (ref 30.0–36.0)
MCV: 94.6 fL (ref 80.0–100.0)
Platelets: 167 10*3/uL (ref 150–400)
RBC: 4.04 MIL/uL (ref 3.87–5.11)
RDW: 14 % (ref 11.5–15.5)
WBC: 9.9 10*3/uL (ref 4.0–10.5)
nRBC: 0 % (ref 0.0–0.2)

## 2021-01-30 LAB — HEMOGLOBIN A1C
Hgb A1c MFr Bld: 6.6 % — ABNORMAL HIGH (ref 4.8–5.6)
Mean Plasma Glucose: 142.72 mg/dL

## 2021-01-30 LAB — POTASSIUM: Potassium: 3.8 mmol/L (ref 3.5–5.1)

## 2021-01-30 NOTE — Progress Notes (Signed)
Pulmonary Critical Care Medicine [facility] [place]   PULMONARY CRITICAL CARE SERVICE  PROGRESS NOTE     Christina Higgins  POE:[id number]  DOB: [birth year]   DOA: 01/19/2021  Referring Physician: Luna Kitchens, MD  HPI: Christina Higgins is a 70 y.o. female being followed for ventilator/airway/oxygen weaning Acute on Chronic Respiratory Failure.  Patient is comfortable without distress oxygen was down to 95% FiO2 and 40 L flow rate  Medications: Reviewed on Rounds  Physical Exam:  Vitals: Temperature is 97.1 pulse of 79 respiratory rate is 20 blood pressure is 103/61 saturations 97%  Ventilator Settings high flow oxygen 40 L and 95% FiO2  General: Comfortable at this time Neck: supple Cardiovascular: no malignant arrhythmias Respiratory: Scattered rhonchi expansion is equal Skin: no rash seen on limited exam Musculoskeletal: No gross abnormality Psychiatric:unable to assess Neurologic:no involuntary movements         Lab Data:   Basic Metabolic Panel: Recent Labs  Lab 01/25/21 0715 01/26/21 0720 01/27/2021 0759 01/29/21 0432 01/30/21 0231  NA 135 134* 135 133* 139  K 3.9 4.3 4.3 4.6 5.5*  CL 102 102 102 99 105  CO2 24 24 25 27 27   GLUCOSE 132* 94 131* 101* 99  BUN 28* 27* 30* 26* 24*  CREATININE 0.45 0.57 0.61 0.51 0.65  CALCIUM 8.3* 8.5* 8.4* 8.5* 8.7*  MG  --  2.3  --   --   --     ABG: Recent Labs  Lab 01/24/21 0500  PHART 7.456*  PCO2ART 30.9*  PO2ART 60.7*  HCO3 21.6  O2SAT 91.6    Liver Function Tests: Recent Labs  Lab 01/12/2021 0759 01/29/21 0432  AST 33 26  ALT 49* 46*  ALKPHOS 80 77  BILITOT 1.0 1.0  PROT 5.7* 5.3*  ALBUMIN 2.5* 2.2*   No results for input(s): LIPASE, AMYLASE in the last 168 hours. No results for input(s): AMMONIA in the last 168 hours.  CBC: Recent Labs  Lab 01/25/21 0715 01/26/21 0720 01/02/2021 0759 01/29/21 0432 01/30/21 0231  WBC 12.5* 13.5* 10.8* 13.3* 9.9  NEUTROABS  --   --  8.5*  10.4*  --   HGB 11.9* 12.6 13.0 14.2 12.4  HCT 35.2* 36.4 39.1 41.7 38.2  MCV 93.6 92.2 93.3 92.7 94.6  PLT 172 175 166 209 167    Cardiac Enzymes: No results for input(s): CKTOTAL, CKMB, CKMBINDEX, TROPONINI in the last 168 hours.  BNP (last 3 results) Recent Labs    01/16/21 0917 01/23/21 0628 01/27/21 0245  BNP 69.3 40.3 21.4    ProBNP (last 3 results) No results for input(s): PROBNP in the last 8760 hours.  Radiological Exams: CT CHEST W CONTRAST  Result Date: 01/29/2021 CLINICAL DATA:  Pneumothorax EXAM: CT CHEST WITH CONTRAST TECHNIQUE: Multidetector CT imaging of the chest was performed during intravenous contrast administration. CONTRAST:  83mL OMNIPAQUE IOHEXOL 350 MG/ML SOLN COMPARISON:  01/16/2021 FINDINGS: Cardiovascular: No significant vascular findings. Normal heart size. No pericardial effusion. Mediastinum/Nodes: Small volume pneumomediastinum. No mediastinal or axillary lymphadenopathy. There is subcutaneous emphysema extending into left chest wall. Lungs/Pleura: There is no pneumothorax. There are bilateral basilar predominant opacities superimposed widespread ground-glass opacity. No pleural effusion. The airways are patent. Upper Abdomen: No acute abnormality. Musculoskeletal: No chest wall abnormality. No acute or significant osseous findings. IMPRESSION: 1. Small volume pneumomediastinum and subcutaneous emphysema extending into the left chest wall and lower neck. No pneumothorax. 2. Bilateral basilar predominant opacities superimposed on widespread ground-glass opacity, concerning for multifocal pneumonia.  Electronically Signed   By: Deatra Robinson M.D.   On: 01/29/2021 03:53   DG CHEST PORT 1 VIEW  Result Date: 01/29/2021 CLINICAL DATA:  Left apical pneumothorax. Cough and shortness of breath. EXAM: PORTABLE CHEST 1 VIEW COMPARISON:  With a 11/19/2020.  Chest CT 01/29/2021 FINDINGS: 0614 hours. Trace pleural or extrapleural gas noted left lung apex with evidence  of pneumomediastinum and subcutaneous emphysema in the left supraclavicular and shoulder regions. The cardio pericardial silhouette is enlarged. Patchy bilateral airspace disease again noted. The visualized bony structures of the thorax show no acute abnormality. Telemetry leads overlie the chest. IMPRESSION: Trace pleural or extrapleural gas noted left lung apex with evidence of pneumomediastinum and subcutaneous emphysema. Patchy bilateral airspace disease appears mildly progressive since previous chest x-ray. Electronically Signed   By: Kennith Center M.D.   On: 01/29/2021 06:35   DG Chest Port 1 View  Result Date: 01/15/2021 CLINICAL DATA:  Respiratory failure. EXAM: PORTABLE CHEST 1 VIEW COMPARISON:  January 27, 2021. FINDINGS: Stable cardiomediastinal silhouette. Interval development pneumomediastinum and pneumopericardium. Stable bilateral interstitial lung densities are noted concerning for infection. Bony thorax is unremarkable. Possible small left apical pneumothorax is noted. Left-sided subcutaneous emphysema is noted. IMPRESSION: Interval development of pneumomediastinum and pneumopericardium concerning for bronchial or esophageal injury; CT scan is recommended for further evaluation. Also noted is possible small left apical pneumothorax. These results will be called to the ordering clinician or representative by the Radiologist Assistant, and communication documented in the PACS or zVision Dashboard. Electronically Signed   By: Lupita Raider M.D.   On: 01/05/2021 15:03    Assessment/Plan Active Problems:   Multifocal pneumonia   Acute on chronic respiratory failure with hypoxia (HCC)   Acute respiratory distress syndrome (ARDS) due to COVID-19 virus (HCC)   COVID-19 virus infection   Acute on chronic respiratory failure with hypoxia plan is going to be to try to continue to wean FiO2 keep saturations greater than 88%. Multifocal pneumonia treated we will continue to follow along  closely very slow to improve ARDS supportive care COVID-19 virus infection in recovery    I have personally seen and evaluated the patient, evaluated laboratory and imaging results, formulated the assessment and plan and placed orders. The Patient requires high complexity decision making with multiple systems involvement.  Rounds were done with the Respiratory Therapy Director and Staff therapists and discussed with nursing staff also.  Yevonne Pax, MD [facility] Pulmonary Critical Care Medicine Sleep Medicine

## 2021-01-30 DEATH — deceased

## 2021-01-31 LAB — TROPONIN I (HIGH SENSITIVITY)
Troponin I (High Sensitivity): 7 ng/L (ref <18)
Troponin I (High Sensitivity): 8 ng/L (ref <18)
Troponin I (High Sensitivity): 6 ng/L (ref <18)

## 2021-01-31 LAB — BASIC METABOLIC PANEL
Anion gap: 10 (ref 5–15)
BUN: 24 mg/dL — ABNORMAL HIGH (ref 8–23)
CO2: 25 mmol/L (ref 22–32)
Calcium: 8.3 mg/dL — ABNORMAL LOW (ref 8.9–10.3)
Chloride: 104 mmol/L (ref 98–111)
Creatinine, Ser: 0.6 mg/dL (ref 0.44–1.00)
GFR, Estimated: >60 mL/min (ref >60)
Glucose, Bld: 168 mg/dL — ABNORMAL HIGH (ref 70–99)
Potassium: 3.8 mmol/L (ref 3.5–5.1)
Sodium: 139 mmol/L (ref 135–145)

## 2021-01-31 LAB — MAGNESIUM: Magnesium: 2.1 mg/dL (ref 1.7–2.4)

## 2021-01-31 NOTE — Progress Notes (Signed)
Pulmonary Critical Care Medicine [facility] [place]   PULMONARY CRITICAL CARE SERVICE  PROGRESS NOTE     Christina Higgins  WUJ:[id number]  DOB: 06/27/1950   DOA: 01/30/21  Referring Physician: Luna Kitchens, MD  HPI: Christina Higgins is a 70 y.o. female being followed for ventilator/airway/oxygen weaning Acute on Chronic Respiratory Failure.  Patient is afebrile without distress remains on 40 L oxygen has been decreased down to 90%  Medications: Reviewed on Rounds  Physical Exam:  Vitals: Temperature as 96.9 pulse 51 respiratory is 18 blood pressure is 110/60 O2 saturations 98%  Ventilator Settings on high flow oxygen FiO2 90% with 40 L flow rate  General: Comfortable at this time Neck: supple Cardiovascular: no malignant arrhythmias Respiratory: Scattered rhonchi expansion is equal Skin: no rash seen on limited exam Musculoskeletal: No gross abnormality Psychiatric:unable to assess Neurologic:no involuntary movements         Lab Data:   Basic Metabolic Panel: Recent Labs  Lab 01/25/21 0715 01/26/21 0720 01/30/2021 0759 01/29/21 0432 01/30/21 0231 01/30/21 2302  NA 135 134* 135 133* 139  --   K 3.9 4.3 4.3 4.6 5.5* 3.8  CL 102 102 102 99 105  --   CO2 24 24 25 27 27   --   GLUCOSE 132* 94 131* 101* 99  --   BUN 28* 27* 30* 26* 24*  --   CREATININE 0.45 0.57 0.61 0.51 0.65  --   CALCIUM 8.3* 8.5* 8.4* 8.5* 8.7*  --   MG  --  2.3  --   --   --   --     ABG: No results for input(s): PHART, PCO2ART, PO2ART, HCO3, O2SAT in the last 168 hours.  Liver Function Tests: Recent Labs  Lab 01/30/2021 0759 01/29/21 0432  AST 33 26  ALT 49* 46*  ALKPHOS 80 77  BILITOT 1.0 1.0  PROT 5.7* 5.3*  ALBUMIN 2.5* 2.2*   No results for input(s): LIPASE, AMYLASE in the last 168 hours. No results for input(s): AMMONIA in the last 168 hours.  CBC: Recent Labs  Lab 01/25/21 0715 01/26/21 0720 Jan 30, 2021 0759 01/29/21 0432 01/30/21 0231  WBC 12.5*  13.5* 10.8* 13.3* 9.9  NEUTROABS  --   --  8.5* 10.4*  --   HGB 11.9* 12.6 13.0 14.2 12.4  HCT 35.2* 36.4 39.1 41.7 38.2  MCV 93.6 92.2 93.3 92.7 94.6  PLT 172 175 166 209 167    Cardiac Enzymes: No results for input(s): CKTOTAL, CKMB, CKMBINDEX, TROPONINI in the last 168 hours.  BNP (last 3 results) Recent Labs    01/16/21 0917 01/23/21 0628 01/27/21 0245  BNP 69.3 40.3 21.4    ProBNP (last 3 results) No results for input(s): PROBNP in the last 8760 hours.  Radiological Exams: No results found.  Assessment/Plan Active Problems:   Multifocal pneumonia   Acute on chronic respiratory failure with hypoxia (HCC)   Acute respiratory distress syndrome (ARDS) due to COVID-19 virus (HCC)   COVID-19 virus infection   Acute on chronic respiratory failure hypoxia we will continue with titrating oxygen down as tolerated Multifocal pneumonia very slow to improve we will continue to follow along. ARDS again slow to improve continue with supportive care COVID-19 virus infection in recovery phase    I have personally seen and evaluated the patient, evaluated laboratory and imaging results, formulated the assessment and plan and placed orders. The Patient requires high complexity decision making with multiple systems involvement.  Rounds were done with  the Respiratory Therapy Director and Staff therapists and discussed with nursing staff also.  Allyne Gee, MD [facility] Pulmonary Critical Care Medicine Sleep Medicine

## 2021-02-01 LAB — CBC
HCT: 35.4 % — ABNORMAL LOW (ref 36.0–46.0)
Hemoglobin: 11.8 g/dL — ABNORMAL LOW (ref 12.0–15.0)
MCH: 31.7 pg (ref 26.0–34.0)
MCHC: 33.3 g/dL (ref 30.0–36.0)
MCV: 95.2 fL (ref 80.0–100.0)
Platelets: 146 10*3/uL — ABNORMAL LOW (ref 150–400)
RBC: 3.72 MIL/uL — ABNORMAL LOW (ref 3.87–5.11)
RDW: 14.3 % (ref 11.5–15.5)
WBC: 13.6 10*3/uL — ABNORMAL HIGH (ref 4.0–10.5)
nRBC: 0 % (ref 0.0–0.2)

## 2021-02-01 LAB — BASIC METABOLIC PANEL
Anion gap: 8 (ref 5–15)
BUN: 20 mg/dL (ref 8–23)
CO2: 28 mmol/L (ref 22–32)
Calcium: 8.3 mg/dL — ABNORMAL LOW (ref 8.9–10.3)
Chloride: 104 mmol/L (ref 98–111)
Creatinine, Ser: 0.54 mg/dL (ref 0.44–1.00)
GFR, Estimated: >60 mL/min (ref >60)
Glucose, Bld: 118 mg/dL — ABNORMAL HIGH (ref 70–99)
Potassium: 3.9 mmol/L (ref 3.5–5.1)
Sodium: 140 mmol/L (ref 135–145)

## 2021-02-01 LAB — MAGNESIUM: Magnesium: 2.1 mg/dL (ref 1.7–2.4)

## 2021-02-01 NOTE — Consult Note (Signed)
Referring Physician: S. Manson Passey, MD  Christina Higgins is an 70 y.o. female.                       Chief Complaint: Aortic valve stenosis and bradycardia  HPI: 70 years old white female, non-smoker, with PMH of acute hypoxic respiratory failure in context of COVID infection on top of previously diagnosed interstitial lung disease and moderate to severe aortic valve stenosis has sinus bradycardia at rest. She is on eye drops with betablocker otherwise no AV blocking medication is in use. Her LV function is normal on recent echocardiogram.  Past Medical History:  Diagnosis Date   Arthritis    Diabetes mellitus without complication (HCC) 07/24/2019   Glaucoma    Hyperlipidemia       Past Surgical History:  Procedure Laterality Date   TUBAL LIGATION      Family History  Problem Relation Age of Onset   Cancer Mother    Heart disease Mother    Hypertension Mother    Stroke Mother    Breast cancer Mother        late 72's, but did have 3 times   Cancer Father    Social History:  reports that she has never smoked. She has never used smokeless tobacco. She reports that she does not drink alcohol and does not use drugs.  Allergies: No Active Allergies  Medications Prior to Admission  Medication Sig Dispense Refill   acetaminophen (TYLENOL) 325 MG tablet Take 2 tablets (650 mg total) by mouth every 6 (six) hours as needed for mild pain (or Fever >/= 101).     apixaban (ELIQUIS) 5 MG TABS tablet Take 2 tablets (10 mg total) by mouth 2 (two) times daily. 60 tablet    chlorhexidine (PERIDEX) 0.12 % solution 15 mLs by Mouth Rinse route 2 (two) times daily. 120 mL 0   Chlorhexidine Gluconate Cloth 2 % PADS Apply 6 each topically daily.     diphenhydrAMINE (BENADRYL) 25 mg capsule Take 1 capsule (25 mg total) by mouth every 6 (six) hours as needed for allergies. 30 capsule 0   feeding supplement (ENSURE ENLIVE / ENSURE PLUS) LIQD Take 237 mLs by mouth 3 (three) times daily between meals. 237  mL 12   fluticasone (FLONASE) 50 MCG/ACT nasal spray Place 1 spray into both nostrils daily.  2   guaiFENesin-dextromethorphan (ROBITUSSIN DM) 100-10 MG/5ML syrup Take 10 mLs by mouth every 4 (four) hours as needed for cough. 118 mL 0   haloperidol lactate (HALDOL) 5 MG/ML injection Inject 0.8 mLs (4 mg total) into the vein every 12 (twelve) hours as needed (for agitated delirium). 1 mL    insulin aspart (NOVOLOG) 100 UNIT/ML injection Inject 0-9 Units into the skin 3 (three) times daily with meals. 10 mL 11   ipratropium-albuterol (DUONEB) 0.5-2.5 (3) MG/3ML SOLN Take 3 mLs by nebulization 3 (three) times daily. 360 mL    latanoprost (XALATAN) 0.005 % ophthalmic solution Place 1 drop into both eyes at bedtime. 2.5 mL 12   lip balm (CARMEX) ointment Apply topically as needed for lip care. 7 g 0   loratadine (CLARITIN) 10 MG tablet Take 1 tablet (10 mg total) by mouth daily as needed for allergies.     melatonin 3 MG TABS tablet Take 1 tablet (3 mg total) by mouth at bedtime.  0   methylPREDNISolone sodium succinate (SOLU-MEDROL) 40 mg/mL injection Inject 1 mL (40 mg total) into the vein daily. 1  each 0   midodrine (PROAMATINE) 5 MG tablet Take 1 tablet (5 mg total) by mouth 3 (three) times daily with meals.     Mouthwashes (MOUTH RINSE) LIQD solution 15 mLs by Mouth Rinse route 2 times daily at 12 noon and 4 pm.  0   Multiple Vitamin (MULTIVITAMIN WITH MINERALS) TABS tablet Take 1 tablet by mouth daily.     omega-3 acid ethyl esters (LOVAZA) 1 g capsule Take 1 capsule (1 g total) by mouth daily.     ondansetron (ZOFRAN) 4 MG tablet Take 1 tablet (4 mg total) by mouth every 6 (six) hours as needed for nausea. 20 tablet 0   senna-docusate (SENOKOT-S) 8.6-50 MG tablet Take 1 tablet by mouth at bedtime as needed for mild constipation.     sodium chloride (OCEAN) 0.65 % SOLN nasal spray Place 1 spray into both nostrils as needed for congestion.  0   sodium chloride 0.9 % infusion Inject 10 mLs into the  vein as needed (For administration of IV meds).  0    Results for orders placed or performed during the hospital encounter of 01/12/2021 (from the past 48 hour(s))  Potassium     Status: None   Collection Time: 01/30/21 11:02 PM  Result Value Ref Range   Potassium 3.8 3.5 - 5.1 mmol/L    Comment: DELTA CHECK NOTED Performed at [facility], [place] [address] [place], [place] [address]   Troponin I (High Sensitivity)     Status: None   Collection Time: 01/31/21  2:24 PM  Result Value Ref Range   Troponin I (High Sensitivity) 7 <18 ng/L    Comment: (NOTE) Elevated high sensitivity troponin I (hsTnI) values and significant  changes across serial measurements may suggest ACS but many other  chronic and acute conditions are known to elevate hsTnI results.  Refer to the "Links" section for chest pain algorithms and additional  guidance. Performed at [facility], [place] [address], [place], [place] [id number]   Basic metabolic panel     Status: Abnormal   Collection Time: 01/31/21  2:24 PM  Result Value Ref Range   Sodium 139 135 - 145 mmol/L   Potassium 3.8 3.5 - 5.1 mmol/L   Chloride 104 98 - 111 mmol/L   CO2 25 22 - 32 mmol/L   Glucose, Bld 168 (H) 70 - 99 mg/dL    Comment: Glucose reference range applies only to samples taken after fasting for at least 8 hours.   BUN 24 (H) 8 - 23 mg/dL   Creatinine, Ser 0.60 0.44 - 1.00 mg/dL   Calcium 8.3 (L) 8.9 - 10.3 mg/dL   GFR, Estimated >60 >60 mL/min    Comment: (NOTE) Calculated using the CKD-EPI Creatinine Equation (2021)    Anion gap 10 5 - 15    Comment: Performed at [place] [address], [place], [place] [address]  Magnesium     Status: None   Collection Time: 01/31/21  2:24 PM  Result Value Ref Range   Magnesium 2.1 1.7 - 2.4 mg/dL    Comment: Performed at [facility], [place] [address] [place],  [address]  Troponin I (High Sensitivity)     Status: None   Collection Time: 01/31/21  6:16 PM   Result Value Ref Range   Troponin I (High Sensitivity) 8 <18 ng/L    Comment: (NOTE) Elevated high sensitivity troponin I (hsTnI) values and significant  changes across serial measurements  may suggest ACS but many other  chronic and acute conditions are known to elevate hsTnI results.  Refer to the "Links" section for chest pain algorithms and additional  guidance. Performed at [facility], [place] [address] [place], [place] [address]   Troponin I (High Sensitivity)     Status: None   Collection Time: 01/31/21 10:08 PM  Result Value Ref Range   Troponin I (High Sensitivity) 6 <18 ng/L    Comment: (NOTE) Elevated high sensitivity troponin I (hsTnI) values and significant  changes across serial measurements may suggest ACS but many other  chronic and acute conditions are known to elevate hsTnI results.  Refer to the "Links" section for chest pain algorithms and additional  guidance. Performed at [facility], [place] [address] [place], [place] [address]    No results found.  Review Of Systems Constitutional: No fever, chills, positive chronic weight loss. Eyes: No vision change, wears reading glasses. No discharge or pain. Positive glaucoma. Ears: No hearing loss, No tinnitus. Respiratory: No asthma, positive COPD, pneumonias, shortness of breath. No hemoptysis. Cardiovascular: No chest pain, palpitation, leg edema. Gastrointestinal: No nausea, vomiting, diarrhea, constipation. No GI bleed. No hepatitis. Genitourinary: No dysuria, hematuria, kidney stone. No incontinance. Neurological: No headache, stroke, seizures.  Psychiatry: No psych facility admission for anxiety, depression, suicide. No detox. Skin: No rash. Musculoskeletal: Positive joint pain, no fibromyalgia. No neck pain, back pain. Lymphadenopathy: No lymphadenopathy. Hematology: No anemia or easy bruising.  P: 47, R: 18, BP: 110/60 O2 sat 98 % on high flow oxygen, 90 % FiO2 and 40 L flow rate. There  were no vitals taken for this visit. There is no height or weight on file to calculate BMI. General appearance: alert, cooperative, appears stated age and moderate respirtory distress Head: Normocephalic, atraumatic. Eyes: Blue eyes, pink conjunctiva, corneas clear. Neck: No adenopathy, no carotid bruit, no JVD, supple, symmetrical, trachea midline and thyroid not enlarged. Resp: Clearing to auscultation bilaterally. Cardio: Regular rate and rhythm, S1, S2 normal, II/VI systolic murmur, no click, rub or gallop GI: Soft, non-tender; bowel sounds normal; no organomegaly. Extremities: No edema, cyanosis or clubbing. Skin: Warm and dry.  Neurologic: Alert and oriented X 3, normal strength.  Assessment/Plan Sinus bradycardia Acute on chronic respiratory failure with hypoxia Interstitial lung disease COVID infection Aortic valve stenosis, moderate to severe LLE DVT Moderate protein calorie malnutrition  Plan: Agree with AV evaluation in near future. Continue Eliquis for LLE DVT. Consider changing beta-blocker eye drop to Xalatan. Increase activity as tolerated.  Time spent: Review of old records, Lab, x-rays, EKG, other cardiac tests, examination, discussion with patient over 70 minutes.  Birdie Riddle, MD  02/01/2021, 3:21 AM

## 2021-02-01 NOTE — Consult Note (Signed)
Ref: Laurann Montana, MD  Subjective:  Sitting up, awake, moderate respiratory distress continues. HR improved with change of Timolol eye drops to Xalatan.  Objective:  Vital Signs in the last 24 hours:    Physical Exam: BP Readings from Last 1 Encounters:  Feb 17, 2021 97/63     Wt Readings from Last 1 Encounters:  01/18/21 65.3 kg    Weight change:  There is no height or weight on file to calculate BMI. HEENT: [place]/AT, Eyes-Blue, Conjunctiva-Pink, Sclera-Non-icteric Neck: No JVD, No bruit, Trachea midline. Lungs:  Clearing, Bilateral. Cardiac:  Regular rhythm, normal S1 and S2, no S3. II/VI systolic murmur. Abdomen:  Soft, non-tender. BS present. Extremities:  No edema present. No cyanosis. No clubbing. CNS: AxOx3, Cranial nerves grossly intact, moves all 4 extremities.  Skin: Warm and dry.   Intake/Output from previous day: No intake/output data recorded.    Lab Results: BMET    Component Value Date/Time   NA 140 02/01/2021 0425   NA 139 01/31/2021 1424   NA 139 01/30/2021 0231   K 3.9 02/01/2021 0425   K 3.8 01/31/2021 1424   K 3.8 01/30/2021 2302   CL 104 02/01/2021 0425   CL 104 01/31/2021 1424   CL 105 01/30/2021 0231   CO2 28 02/01/2021 0425   CO2 25 01/31/2021 1424   CO2 27 01/30/2021 0231   GLUCOSE 118 (H) 02/01/2021 0425   GLUCOSE 168 (H) 01/31/2021 1424   GLUCOSE 99 01/30/2021 0231   BUN 20 02/01/2021 0425   BUN 24 (H) 01/31/2021 1424   BUN 24 (H) 01/30/2021 0231   CREATININE 0.54 02/01/2021 0425   CREATININE 0.60 01/31/2021 1424   CREATININE 0.65 01/30/2021 0231   CALCIUM 8.3 (L) 02/01/2021 0425   CALCIUM 8.3 (L) 01/31/2021 1424   CALCIUM 8.7 (L) 01/30/2021 0231   GFRNONAA >60 02/01/2021 0425   GFRNONAA >60 01/31/2021 1424   GFRNONAA >60 01/30/2021 0231   CBC    Component Value Date/Time   WBC 13.6 (H) 02/01/2021 0425   RBC 3.72 (L) 02/01/2021 0425   HGB 11.8 (L) 02/01/2021 0425   HCT 35.4 (L) 02/01/2021 0425   PLT 146 (L) 02/01/2021 0425    MCV 95.2 02/01/2021 0425   MCH 31.7 02/01/2021 0425   MCHC 33.3 02/01/2021 0425   RDW 14.3 02/01/2021 0425   LYMPHSABS 1.8 01/29/2021 0432   MONOABS 0.6 01/29/2021 0432   EOSABS 0.3 01/29/2021 0432   BASOSABS 0.0 01/29/2021 0432   HEPATIC Function Panel Recent Labs    01/17/21 0341 02/17/2021 0759 01/29/21 0432  PROT 5.3* 5.7* 5.3*   HEMOGLOBIN A1C No components found for: HGA1C,  MPG CARDIAC ENZYMES No results found for: CKTOTAL, CKMB, CKMBINDEX, TROPONINI BNP No results for input(s): PROBNP in the last 8760 hours. TSH No results for input(s): TSH in the last 8760 hours. CHOLESTEROL No results for input(s): CHOL in the last 8760 hours.  Scheduled Meds: Continuous Infusions: PRN Meds:.  Assessment/Plan:  Sinus bradycardia, resolved Acute on chronic respiratory distress with hypoxia Interstitial lung disease S/P COVID infection AS, moderate to severe LLE DVT Moderate protein calorie malnutrition  Plan: Increase activity as tolerated.   LOS: 0 days   Time spent including chart review, lab review, examination, discussion with patient/Nurse : 30 min   Orpah Cobb  MD  02/01/2021, 11:33 AM

## 2021-02-03 ENCOUNTER — Other Ambulatory Visit (HOSPITAL_COMMUNITY): Payer: Self-pay

## 2021-02-03 LAB — CBC
HCT: 36.1 % (ref 36.0–46.0)
Hemoglobin: 12.1 g/dL (ref 12.0–15.0)
MCH: 31.2 pg (ref 26.0–34.0)
MCHC: 33.5 g/dL (ref 30.0–36.0)
MCV: 93 fL (ref 80.0–100.0)
Platelets: 165 10*3/uL (ref 150–400)
RBC: 3.88 MIL/uL (ref 3.87–5.11)
RDW: 14.4 % (ref 11.5–15.5)
WBC: 8.6 10*3/uL (ref 4.0–10.5)
nRBC: 0 % (ref 0.0–0.2)

## 2021-02-03 LAB — TSH: TSH: 2.971 u[IU]/mL (ref 0.350–4.500)

## 2021-02-03 NOTE — Progress Notes (Signed)
Pulmonary Critical Care Medicine [facility] [facility]   PULMONARY CRITICAL CARE SERVICE  PROGRESS NOTE     Christina Higgins  [id number]  DOB: 06/02/1950   DOA: 01/07/2021  Referring Physician: Satira Sark, MD  HPI: Christina Higgins is a 70 y.o. female being followed for ventilator/airway/oxygen weaning Acute on Chronic Respiratory Failure.  She continues to do poorly remains on 40 L oxygen 90% FiO2.  In addition to that patient was placed on 100% nonrebreather.  She is awake otherwise appears to be no different.  Also cardiology did see the patient had no major new recommendations  Medications: Reviewed on Rounds  Physical Exam:  Vitals: Temperature is 97.5 pulse 75 respiratory rate is 20 blood pressure is 91/43 saturations 95%  Ventilator Settings on 40 L oxygen with an FiO2 of 90%  General: Comfortable at this time Neck: supple Cardiovascular: no malignant arrhythmias Respiratory: Scattered rhonchi expansion is equal Skin: no rash seen on limited exam Musculoskeletal: No gross abnormality Psychiatric:unable to assess Neurologic:no involuntary movements         Lab Data:   Basic Metabolic Panel: Recent Labs  Lab 01/18/2021 0759 01/29/21 0432 01/30/21 0231 01/30/21 2302 01/31/21 1424 02/01/21 0425  NA 135 133* 139  --  139 140  K 4.3 4.6 5.5* 3.8 3.8 3.9  CL 102 99 105  --  104 104  CO2 25 27 27   --  25 28  GLUCOSE 131* 101* 99  --  168* 118*  BUN 30* 26* 24*  --  24* 20  CREATININE 0.61 0.51 0.65  --  0.60 0.54  CALCIUM 8.4* 8.5* 8.7*  --  8.3* 8.3*  MG  --   --   --   --  2.1 2.1    ABG: No results for input(s): PHART, PCO2ART, PO2ART, HCO3, O2SAT in the last 168 hours.  Liver Function Tests: Recent Labs  Lab 01/29/2021 0759 01/29/21 0432  AST 33 26  ALT 49* 46*  ALKPHOS 80 77  BILITOT 1.0 1.0  PROT 5.7* 5.3*  ALBUMIN 2.5* 2.2*   No results for input(s): LIPASE, AMYLASE in the last 168 hours. No results for input(s):  AMMONIA in the last 168 hours.  CBC: Recent Labs  Lab 01/07/2021 0759 01/29/21 0432 01/30/21 0231 02/01/21 0425  WBC 10.8* 13.3* 9.9 13.6*  NEUTROABS 8.5* 10.4*  --   --   HGB 13.0 14.2 12.4 11.8*  HCT 39.1 41.7 38.2 35.4*  MCV 93.3 92.7 94.6 95.2  PLT 166 209 167 146*    Cardiac Enzymes: No results for input(s): CKTOTAL, CKMB, CKMBINDEX, TROPONINI in the last 168 hours.  BNP (last 3 results) Recent Labs    01/16/21 0917 01/23/21 0628 01/27/21 0245  BNP 69.3 40.3 21.4    ProBNP (last 3 results) No results for input(s): PROBNP in the last 8760 hours.  Radiological Exams: No results found.  Assessment/Plan Active Problems:   Multifocal pneumonia   Acute on chronic respiratory failure with hypoxia (HCC)   Acute respiratory distress syndrome (ARDS) due to COVID-19 virus (HCC)   COVID-19 virus infection   Acute on chronic respiratory failure with hypoxia the plan again try to titrate oxygen down to the lowest effective level however patient's not doing well overall and may be heading towards intubation.  As previously discussed the patient and husband want intubation only for short while as outlined previously and if there is no sign of improvement then they would want withdrawal of life support however they  did not want any thing further as far as tracheostomy etc. 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 The Patient requires high complexity decision making with multiple systems involvement.  Rounds  were done with the Respiratory Therapy Director and Staff therapists and discussed with nursing staff also.  Yevonne Pax, MD [facility] Pulmonary Critical Care Medicine Sleep Medicine

## 2021-02-04 LAB — CBC
HCT: 34.1 % — ABNORMAL LOW (ref 36.0–46.0)
Hemoglobin: 11.3 g/dL — ABNORMAL LOW (ref 12.0–15.0)
MCH: 31.5 pg (ref 26.0–34.0)
MCHC: 33.1 g/dL (ref 30.0–36.0)
MCV: 95 fL (ref 80.0–100.0)
Platelets: 146 10*3/uL — ABNORMAL LOW (ref 150–400)
RBC: 3.59 MIL/uL — ABNORMAL LOW (ref 3.87–5.11)
RDW: 14.5 % (ref 11.5–15.5)
WBC: 8.3 10*3/uL (ref 4.0–10.5)
nRBC: 0 % (ref 0.0–0.2)

## 2021-02-04 LAB — BASIC METABOLIC PANEL
Anion gap: 8 (ref 5–15)
BUN: 14 mg/dL (ref 8–23)
CO2: 25 mmol/L (ref 22–32)
Calcium: 8.3 mg/dL — ABNORMAL LOW (ref 8.9–10.3)
Chloride: 107 mmol/L (ref 98–111)
Creatinine, Ser: 0.57 mg/dL (ref 0.44–1.00)
GFR, Estimated: >60 mL/min (ref >60)
Glucose, Bld: 90 mg/dL (ref 70–99)
Potassium: 4 mmol/L (ref 3.5–5.1)
Sodium: 140 mmol/L (ref 135–145)

## 2021-02-04 LAB — BLOOD GAS, ARTERIAL
Acid-Base Excess: 0.2 mmol/L (ref 0.0–2.0)
Bicarbonate: 24.1 mmol/L (ref 20.0–28.0)
FIO2: 80
O2 Saturation: 93.6 %
Patient temperature: 36.7
pCO2 arterial: 37 mmHg (ref 32.0–48.0)
pH, Arterial: 7.428 (ref 7.350–7.450)
pO2, Arterial: 68.2 mmHg — ABNORMAL LOW (ref 83.0–108.0)

## 2021-02-04 NOTE — Progress Notes (Signed)
Pulmonary Critical Care Medicine [facility] [place]   PULMONARY CRITICAL CARE SERVICE  PROGRESS NOTE     GLENDIA OLSHEFSKI  GUY:[id number]  DOB: 08-02-1950   DOA: 2021-02-02  Referring Physician: Luna Kitchens, MD  HPI: Christina Higgins is a 70 y.o. female being followed for ventilator/airway/oxygen weaning Acute on Chronic Respiratory Failure.  Patient is on 40 L oxygen flow rate and 100% FiO2  Medications: Reviewed on Rounds  Physical Exam:  Vitals: Temperature is 97.1 pulse 66 respiratory rate is 30 blood pressure is 105/59 saturations 93%  Ventilator Settings on heated high flow  General: Comfortable at this time Neck: supple Cardiovascular: no malignant arrhythmias Respiratory: Scattered rhonchi expansion is equal Skin: no rash seen on limited exam Musculoskeletal: No gross abnormality Psychiatric:unable to assess Neurologic:no involuntary movements         Lab Data:   Basic Metabolic Panel: Recent Labs  Lab 01/29/21 0432 01/30/21 0231 01/30/21 2302 01/31/21 1424 02/01/21 0425 02/04/21 0333  NA 133* 139  --  139 140 140  K 4.6 5.5* 3.8 3.8 3.9 4.0  CL 99 105  --  104 104 107  CO2 27 27  --  25 28 25   GLUCOSE 101* 99  --  168* 118* 90  BUN 26* 24*  --  24* 20 14  CREATININE 0.51 0.65  --  0.60 0.54 0.57  CALCIUM 8.5* 8.7*  --  8.3* 8.3* 8.3*  MG  --   --   --  2.1 2.1  --     ABG: No results for input(s): PHART, PCO2ART, PO2ART, HCO3, O2SAT in the last 168 hours.  Liver Function Tests: Recent Labs  Lab 01/29/21 0432  AST 26  ALT 46*  ALKPHOS 77  BILITOT 1.0  PROT 5.3*  ALBUMIN 2.2*   No results for input(s): LIPASE, AMYLASE in the last 168 hours. No results for input(s): AMMONIA in the last 168 hours.  CBC: Recent Labs  Lab 01/29/21 0432 01/30/21 0231 02/01/21 0425 02/03/21 1058 02/04/21 0333  WBC 13.3* 9.9 13.6* 8.6 8.3  NEUTROABS 10.4*  --   --   --   --   HGB 14.2 12.4 11.8* 12.1 11.3*  HCT 41.7 38.2 35.4*  36.1 34.1*  MCV 92.7 94.6 95.2 93.0 95.0  PLT 209 167 146* 165 146*    Cardiac Enzymes: No results for input(s): CKTOTAL, CKMB, CKMBINDEX, TROPONINI in the last 168 hours.  BNP (last 3 results) Recent Labs    01/16/21 0917 01/23/21 0628 01/27/21 0245  BNP 69.3 40.3 21.4    ProBNP (last 3 results) No results for input(s): PROBNP in the last 8760 hours.  Radiological Exams: DG CHEST PORT 1 VIEW  Result Date: 02/03/2021 CLINICAL DATA:  Respiratory failure EXAM: PORTABLE CHEST 1 VIEW COMPARISON:  Chest radiograph 01/29/2021 FINDINGS: The cardiomediastinal silhouette is stable. Pneumomediastinum is no longer seen. There increased interstitial markings throughout both lungs with superimposed areas of focal patchy opacity, overall similar to the prior study. There is no new or worsening focal airspace disease. There is no significant pleural effusion. No definite pneumothorax is seen. Previously seen subcutaneous emphysema in the left chest wall and neck is no longer seen. IMPRESSION: 1. Patchy airspace disease in both lungs is overall not significantly changed since 01/29/2021. There is no new or worsening focal airspace disease. 2. No definite pneumothorax is identified. 3. Previously seen pneumomediastinum and subcutaneous emphysema in the left chest wall and neck are no longer seen. Electronically Signed  By: Lesia Hausen M.D.   On: 02/03/2021 10:39    Assessment/Plan Active Problems:   Multifocal pneumonia   Acute on chronic respiratory failure with hypoxia (HCC)   Acute respiratory distress syndrome (ARDS) due to COVID-19 virus (HCC)   COVID-19 virus infection   Acute on chronic respiratory failure with hypoxia she continues to require high oxygen therapy she is awake and alert and appropriate.  I had another discussion with her regarding plan of care I think at this time we can place her on BiPAP the last chest x-ray had shown no worsening no pneumothorax no pneumomediastinum.  If  she does not respond to BiPAP then she understands that she may need intubation however right now she is awake alert and oriented Multifocal pneumonia we will continue to monitor along closely consider a CT COVID-19 virus infection in recovery very slow to improve ARDS again very slow to improve secondary to the COVID-19    I have personally seen and evaluated the patient, evaluated laboratory and imaging results, formulated the assessment and plan and placed orders. The Patient requires high complexity decision making with multiple systems involvement.  Rounds were done with the Respiratory Therapy Director and Staff therapists and discussed with nursing staff also.  Yevonne Pax, MD [facility] Pulmonary Critical Care Medicine Sleep Medicine

## 2021-02-05 NOTE — Progress Notes (Signed)
Pulmonary Critical Care Medicine [facility] [place]   PULMONARY CRITICAL CARE SERVICE  PROGRESS NOTE     Christina Higgins  [id number]  DOB: 05/14/1950   DOA: 01/22/2021  Referring Physician: Luna Kitchens, MD  HPI: Christina Higgins is a 70 y.o. female being followed for ventilator/airway/oxygen weaning Acute on Chronic Respiratory Failure.  Patient is afebrile on BiPAP overnight she actually states that she did fine used it for about 4 hours.  I did review the CT scan which was done on November 17 with the patient's husband as well as the x-rays the latest films do show improvement compared to previous films from November her oxygen requirements may be decreased I think to maintain saturations greater than 85 to 88%  Medications: Reviewed on Rounds  Physical Exam:  Vitals: Temperature is 97.0 pulse 60 respiratory 27 blood pressure is 109/64 saturations 97%  Ventilator Settings on the BiPAP appears to be comfortable without distress  General: Comfortable at this time Neck: supple Cardiovascular: no malignant arrhythmias Respiratory: Scattered rhonchi expansion is equal Skin: no rash seen on limited exam Musculoskeletal: No gross abnormality Psychiatric:unable to assess Neurologic:no involuntary movements         Lab Data:   Basic Metabolic Panel: Recent Labs  Lab 01/30/21 0231 01/30/21 2302 01/31/21 1424 02/01/21 0425 02/04/21 0333  NA 139  --  139 140 140  K 5.5* 3.8 3.8 3.9 4.0  CL 105  --  104 104 107  CO2 27  --  25 28 25   GLUCOSE 99  --  168* 118* 90  BUN 24*  --  24* 20 14  CREATININE 0.65  --  0.60 0.54 0.57  CALCIUM 8.7*  --  8.3* 8.3* 8.3*  MG  --   --  2.1 2.1  --     ABG: Recent Labs  Lab 02/04/21 1615  PHART 7.428  PCO2ART 37.0  PO2ART 68.2*  HCO3 24.1  O2SAT 93.6    Liver Function Tests: No results for input(s): AST, ALT, ALKPHOS, BILITOT, PROT, ALBUMIN in the last 168 hours. No results for input(s): LIPASE,  AMYLASE in the last 168 hours. No results for input(s): AMMONIA in the last 168 hours.  CBC: Recent Labs  Lab 01/30/21 0231 02/01/21 0425 02/03/21 1058 02/04/21 0333  WBC 9.9 13.6* 8.6 8.3  HGB 12.4 11.8* 12.1 11.3*  HCT 38.2 35.4* 36.1 34.1*  MCV 94.6 95.2 93.0 95.0  PLT 167 146* 165 146*    Cardiac Enzymes: No results for input(s): CKTOTAL, CKMB, CKMBINDEX, TROPONINI in the last 168 hours.  BNP (last 3 results) Recent Labs    01/16/21 0917 01/23/21 0628 01/27/21 0245  BNP 69.3 40.3 21.4    ProBNP (last 3 results) No results for input(s): PROBNP in the last 8760 hours.  Radiological Exams: DG CHEST PORT 1 VIEW  Result Date: 02/03/2021 CLINICAL DATA:  Respiratory failure EXAM: PORTABLE CHEST 1 VIEW COMPARISON:  Chest radiograph 01/29/2021 FINDINGS: The cardiomediastinal silhouette is stable. Pneumomediastinum is no longer seen. There increased interstitial markings throughout both lungs with superimposed areas of focal patchy opacity, overall similar to the prior study. There is no new or worsening focal airspace disease. There is no significant pleural effusion. No definite pneumothorax is seen. Previously seen subcutaneous emphysema in the left chest wall and neck is no longer seen. IMPRESSION: 1. Patchy airspace disease in both lungs is overall not significantly changed since 01/29/2021. There is no new or worsening focal airspace disease. 2. No definite pneumothorax  is identified. 3. Previously seen pneumomediastinum and subcutaneous emphysema in the left chest wall and neck are no longer seen. Electronically Signed   By: Lesia Hausen M.D.   On: 02/03/2021 10:39    Assessment/Plan Active Problems:   Multifocal pneumonia   Acute on chronic respiratory failure with hypoxia (HCC)   Acute respiratory distress syndrome (ARDS) due to COVID-19 virus (HCC)   COVID-19 virus infection   Acute on chronic respiratory failure with hypoxia plan is to try to wean the FiO2 down  overall radiologically compared to November 17 it does appear to be a little bit improved I have suggested that we do a follow-up CT scan of the chest Multifocal pneumonia diffuse disease plan is to get a follow-up CT scan of the chest to reassess ARDS supportive care slow to improve oxygenation requirements minimally improved COVID-19 virus infection supportive care in the recovery phase    I have personally seen and evaluated the patient, evaluated laboratory and imaging results, formulated the assessment and plan and placed orders. The Patient requires high complexity decision making with multiple systems involvement.  Rounds were done with the Respiratory Therapy Director and Staff therapists and discussed with nursing staff also.  Yevonne Pax, MD [facility] Pulmonary Critical Care Medicine Sleep Medicine

## 2021-02-06 ENCOUNTER — Other Ambulatory Visit: Payer: Medicare Other

## 2021-02-06 LAB — BASIC METABOLIC PANEL
Anion gap: 7 (ref 5–15)
BUN: 18 mg/dL (ref 8–23)
CO2: 25 mmol/L (ref 22–32)
Calcium: 8.5 mg/dL — ABNORMAL LOW (ref 8.9–10.3)
Chloride: 105 mmol/L (ref 98–111)
Creatinine, Ser: 0.51 mg/dL (ref 0.44–1.00)
GFR, Estimated: >60 mL/min (ref >60)
Glucose, Bld: 167 mg/dL — ABNORMAL HIGH (ref 70–99)
Potassium: 3.8 mmol/L (ref 3.5–5.1)
Sodium: 137 mmol/L (ref 135–145)

## 2021-02-06 LAB — CBC
HCT: 32.3 % — ABNORMAL LOW (ref 36.0–46.0)
Hemoglobin: 10.8 g/dL — ABNORMAL LOW (ref 12.0–15.0)
MCH: 31.7 pg (ref 26.0–34.0)
MCHC: 33.4 g/dL (ref 30.0–36.0)
MCV: 94.7 fL (ref 80.0–100.0)
Platelets: 157 10*3/uL (ref 150–400)
RBC: 3.41 MIL/uL — ABNORMAL LOW (ref 3.87–5.11)
RDW: 15 % (ref 11.5–15.5)
WBC: 9.6 10*3/uL (ref 4.0–10.5)
nRBC: 0 % (ref 0.0–0.2)

## 2021-02-06 LAB — MAGNESIUM: Magnesium: 2.2 mg/dL (ref 1.7–2.4)

## 2021-02-06 NOTE — Progress Notes (Signed)
Pulmonary Critical Care Medicine [facility] [place]   PULMONARY CRITICAL CARE SERVICE  PROGRESS NOTE     Christina Higgins  TML:[id number]  DOB: [birth year]   DOA: 01/06/2021  Referring Physician: Luna Kitchens, MD  HPI: Christina Higgins is a 70 y.o. female being followed for ventilator/airway/oxygen weaning Acute on Chronic Respiratory Failure.  Patient is using the BiPAP at nighttime still on heated high flow.  Spoke to the husband at the bedside and explained to him that she is doing about the same ABG shows that she is adequately ventilating however still severely hypoxic.  Also reviewed the CT scan from November 30 with the patient showed the diffuse damage as a result of the COVID-19 with groundglass opacities and explained that we still do not know which direction she is going to go as far as recovery is concerned however right now she is holding her own and would hold off on intubation unless she becomes unresponsive or has acute decline of her current status.  At this time she is awake alert and oriented  Medications: Reviewed on Rounds  Physical Exam:  Vitals: Temperature is 97.8 pulse 75 respiratory is 25 blood pressure 118/70 saturations 93%  Ventilator Settings on BiPAP at nighttime  General: Comfortable at this time Neck: supple Cardiovascular: no malignant arrhythmias Respiratory: Scattered rhonchi Skin: no rash seen on limited exam Musculoskeletal: No gross abnormality Psychiatric:unable to assess Neurologic:no involuntary movements         Lab Data:   Basic Metabolic Panel: Recent Labs  Lab 01/30/21 2302 01/31/21 1424 02/01/21 0425 02/04/21 0333 02/06/21 0358  NA  --  139 140 140 137  K 3.8 3.8 3.9 4.0 3.8  CL  --  104 104 107 105  CO2  --  25 28 25 25   GLUCOSE  --  168* 118* 90 167*  BUN  --  24* 20 14 18   CREATININE  --  0.60 0.54 0.57 0.51  CALCIUM  --  8.3* 8.3* 8.3* 8.5*  MG  --  2.1 2.1  --  2.2    ABG: Recent Labs   Lab 02/04/21 1615  PHART 7.428  PCO2ART 37.0  PO2ART 68.2*  HCO3 24.1  O2SAT 93.6    Liver Function Tests: No results for input(s): AST, ALT, ALKPHOS, BILITOT, PROT, ALBUMIN in the last 168 hours. No results for input(s): LIPASE, AMYLASE in the last 168 hours. No results for input(s): AMMONIA in the last 168 hours.  CBC: Recent Labs  Lab 02/01/21 0425 02/03/21 1058 02/04/21 0333 02/06/21 0358  WBC 13.6* 8.6 8.3 9.6  HGB 11.8* 12.1 11.3* 10.8*  HCT 35.4* 36.1 34.1* 32.3*  MCV 95.2 93.0 95.0 94.7  PLT 146* 165 146* 157    Cardiac Enzymes: No results for input(s): CKTOTAL, CKMB, CKMBINDEX, TROPONINI in the last 168 hours.  BNP (last 3 results) Recent Labs    01/16/21 0917 01/23/21 0628 01/27/21 0245  BNP 69.3 40.3 21.4    ProBNP (last 3 results) No results for input(s): PROBNP in the last 8760 hours.  Radiological Exams: No results found.  Assessment/Plan Active Problems:   Multifocal pneumonia   Acute on chronic respiratory failure with hypoxia (HCC)   Acute respiratory distress syndrome (ARDS) due to COVID-19 virus (HCC)   COVID-19 virus infection   Acute on chronic respiratory failure hypoxia plan is to continue with heated high flow to try titrating oxygen down as tolerated.  Also recommend continuing to use the BiPAP at nighttime for ventilatory  support Multifocal pneumonia still with diffuse pulmonary damage noted on the previous CT as well as x-rays COVID-19 virus infection in recovery very slow to improve ARDS patient shows diffuse pulmonary damage    I have personally seen and evaluated the patient, evaluated laboratory and imaging results, formulated the assessment and plan and placed orders. The Patient requires high complexity decision making with multiple systems involvement.  Rounds were done with the Respiratory Therapy Director and Staff therapists and discussed with nursing staff also.  Yevonne Pax, MD [facility] Pulmonary Critical Care  Medicine Sleep Medicine

## 2021-02-07 NOTE — Progress Notes (Signed)
Pulmonary Critical Care Medicine [facility] [place]   PULMONARY CRITICAL CARE SERVICE  PROGRESS NOTE     Christina Higgins  [id number]  DOB: 1950-03-14   DOA: 01/07/2021  Referring Physician: Luna Kitchens, MD  HPI: Christina Higgins is a 70 y.o. female being followed for ventilator/airway/oxygen weaning Acute on Chronic Respiratory Failure.  Patient afebrile comfortable without distress has been using BiPAP at nighttime in the high flow during daytime  Medications: Reviewed on Rounds  Physical Exam:  Vitals: Temperature is 96.9 pulse of 80 respiratory rate 28 blood pressure is 108/63 saturations 93%  Ventilator Settings currently on BiPAP 12/8 100% FiO2  General: Comfortable at this time Neck: supple Cardiovascular: no malignant arrhythmias Respiratory: No rhonchi no rales are noted at this time Skin: no rash seen on limited exam Musculoskeletal: No gross abnormality Psychiatric:unable to assess Neurologic:no involuntary movements         Lab Data:   Basic Metabolic Panel: Recent Labs  Lab 01/31/21 1424 02/01/21 0425 02/04/21 0333 02/06/21 0358  NA 139 140 140 137  K 3.8 3.9 4.0 3.8  CL 104 104 107 105  CO2 25 28 25 25   GLUCOSE 168* 118* 90 167*  BUN 24* 20 14 18   CREATININE 0.60 0.54 0.57 0.51  CALCIUM 8.3* 8.3* 8.3* 8.5*  MG 2.1 2.1  --  2.2    ABG: Recent Labs  Lab 02/04/21 1615  PHART 7.428  PCO2ART 37.0  PO2ART 68.2*  HCO3 24.1  O2SAT 93.6    Liver Function Tests: No results for input(s): AST, ALT, ALKPHOS, BILITOT, PROT, ALBUMIN in the last 168 hours. No results for input(s): LIPASE, AMYLASE in the last 168 hours. No results for input(s): AMMONIA in the last 168 hours.  CBC: Recent Labs  Lab 02/01/21 0425 02/03/21 1058 02/04/21 0333 02/06/21 0358  WBC 13.6* 8.6 8.3 9.6  HGB 11.8* 12.1 11.3* 10.8*  HCT 35.4* 36.1 34.1* 32.3*  MCV 95.2 93.0 95.0 94.7  PLT 146* 165 146* 157    Cardiac Enzymes: No results  for input(s): CKTOTAL, CKMB, CKMBINDEX, TROPONINI in the last 168 hours.  BNP (last 3 results) Recent Labs    01/16/21 0917 01/23/21 0628 01/27/21 0245  BNP 69.3 40.3 21.4    ProBNP (last 3 results) No results for input(s): PROBNP in the last 8760 hours.  Radiological Exams: No results found.  Assessment/Plan Active Problems:   Multifocal pneumonia   Acute on chronic respiratory failure with hypoxia (HCC)   Acute respiratory distress syndrome (ARDS) due to COVID-19 virus (HCC)   COVID-19 virus infection   Acute on chronic respiratory failure hypoxia plan is to continue with BiPAP as ordered titrate oxygen continue pulmonary toilet. COVID-19 virus infection in recovery phase we will continue to monitor along closely Multifocal pneumonia has been treated continue supportive care ARDS no change very slow to improve    I have personally seen and evaluated the patient, evaluated laboratory and imaging results, formulated the assessment and plan and placed orders. The Patient requires high complexity decision making with multiple systems involvement.  Rounds were done with the Respiratory Therapy Director and Staff therapists and discussed with nursing staff also.  01/25/21, MD [facility] Pulmonary Critical Care Medicine Sleep Medicine

## 2021-02-08 LAB — BASIC METABOLIC PANEL
Anion gap: 8 (ref 5–15)
BUN: 18 mg/dL (ref 8–23)
CO2: 27 mmol/L (ref 22–32)
Calcium: 8.6 mg/dL — ABNORMAL LOW (ref 8.9–10.3)
Chloride: 104 mmol/L (ref 98–111)
Creatinine, Ser: 0.52 mg/dL (ref 0.44–1.00)
GFR, Estimated: >60 mL/min (ref >60)
Glucose, Bld: 110 mg/dL — ABNORMAL HIGH (ref 70–99)
Potassium: 4 mmol/L (ref 3.5–5.1)
Sodium: 139 mmol/L (ref 135–145)

## 2021-02-08 LAB — CBC
HCT: 37.1 % (ref 36.0–46.0)
Hemoglobin: 12.3 g/dL (ref 12.0–15.0)
MCH: 31.5 pg (ref 26.0–34.0)
MCHC: 33.2 g/dL (ref 30.0–36.0)
MCV: 95.1 fL (ref 80.0–100.0)
Platelets: 177 10*3/uL (ref 150–400)
RBC: 3.9 MIL/uL (ref 3.87–5.11)
RDW: 15.4 % (ref 11.5–15.5)
WBC: 8.8 10*3/uL (ref 4.0–10.5)
nRBC: 0 % (ref 0.0–0.2)

## 2021-02-08 LAB — MAGNESIUM: Magnesium: 2.2 mg/dL (ref 1.7–2.4)

## 2021-02-10 ENCOUNTER — Other Ambulatory Visit (HOSPITAL_COMMUNITY): Payer: Self-pay

## 2021-02-10 LAB — CBC
HCT: 35.2 % — ABNORMAL LOW (ref 36.0–46.0)
Hemoglobin: 11.7 g/dL — ABNORMAL LOW (ref 12.0–15.0)
MCH: 31.5 pg (ref 26.0–34.0)
MCHC: 33.2 g/dL (ref 30.0–36.0)
MCV: 94.6 fL (ref 80.0–100.0)
Platelets: 172 10*3/uL (ref 150–400)
RBC: 3.72 MIL/uL — ABNORMAL LOW (ref 3.87–5.11)
RDW: 15.5 % (ref 11.5–15.5)
WBC: 10.6 10*3/uL — ABNORMAL HIGH (ref 4.0–10.5)
nRBC: 0 % (ref 0.0–0.2)

## 2021-02-10 LAB — BASIC METABOLIC PANEL
Anion gap: 8 (ref 5–15)
BUN: 16 mg/dL (ref 8–23)
CO2: 27 mmol/L (ref 22–32)
Calcium: 8.7 mg/dL — ABNORMAL LOW (ref 8.9–10.3)
Chloride: 102 mmol/L (ref 98–111)
Creatinine, Ser: 0.47 mg/dL (ref 0.44–1.00)
GFR, Estimated: >60 mL/min (ref >60)
Glucose, Bld: 98 mg/dL (ref 70–99)
Potassium: 3.9 mmol/L (ref 3.5–5.1)
Sodium: 137 mmol/L (ref 135–145)

## 2021-02-13 ENCOUNTER — Inpatient Hospital Stay: Payer: Medicare Other | Admitting: Pulmonary Disease

## 2021-02-13 ENCOUNTER — Other Ambulatory Visit (HOSPITAL_COMMUNITY): Payer: Self-pay

## 2021-02-13 LAB — CBC
HCT: 39.3 % (ref 36.0–46.0)
Hemoglobin: 13.1 g/dL (ref 12.0–15.0)
MCH: 31.5 pg (ref 26.0–34.0)
MCHC: 33.3 g/dL (ref 30.0–36.0)
MCV: 94.5 fL (ref 80.0–100.0)
Platelets: 189 10*3/uL (ref 150–400)
RBC: 4.16 MIL/uL (ref 3.87–5.11)
RDW: 15.9 % — ABNORMAL HIGH (ref 11.5–15.5)
WBC: 8.2 10*3/uL (ref 4.0–10.5)
nRBC: 0 % (ref 0.0–0.2)

## 2021-02-13 LAB — PROTIME-INR
INR: 1.3 — ABNORMAL HIGH (ref 0.8–1.2)
Prothrombin Time: 16.6 seconds — ABNORMAL HIGH (ref 11.4–15.2)

## 2021-02-15 ENCOUNTER — Other Ambulatory Visit (HOSPITAL_COMMUNITY): Payer: Self-pay

## 2021-02-16 NOTE — Progress Notes (Signed)
Pulmonary Critical Care Medicine [facility] [place]   PULMONARY CRITICAL CARE SERVICE  PROGRESS NOTE     RUTHIA PERSON  ATF:[id number]  DOB: [birth year]   DOA: 01/13/2021  Referring Physician: Luna Kitchens, MD  HPI: TUNYA HELD is a 70 y.o. female being followed for ventilator/airway/oxygen weaning Acute on Chronic Respiratory Failure.  Still trying to wean her oxygen patient remains on high flow oxygen with 35 L flow rate and 60% which is a significant improvement  Medications: Reviewed on Rounds  Physical Exam:  Vitals: Temperature was 98.9 pulse 74 respiratory is 30 blood pressure 101/63 saturations 97%  Ventilator Settings for the ventilator on heated high flow oxygen 35 L and 60% FiO2  General: Comfortable at this time Neck: supple Cardiovascular: no malignant arrhythmias Respiratory: No rhonchi very coarse breath sounds are noted Skin: no rash seen on limited exam Musculoskeletal: No gross abnormality Psychiatric:unable to assess Neurologic:no involuntary movements         Lab Data:   Basic Metabolic Panel: Recent Labs  Lab 02/10/21 0447  NA 137  K 3.9  CL 102  CO2 27  GLUCOSE 98  BUN 16  CREATININE 0.47  CALCIUM 8.7*    ABG: No results for input(s): PHART, PCO2ART, PO2ART, HCO3, O2SAT in the last 168 hours.  Liver Function Tests: No results for input(s): AST, ALT, ALKPHOS, BILITOT, PROT, ALBUMIN in the last 168 hours. No results for input(s): LIPASE, AMYLASE in the last 168 hours. No results for input(s): AMMONIA in the last 168 hours.  CBC: Recent Labs  Lab 02/10/21 0447 02/13/21 0435  WBC 10.6* 8.2  HGB 11.7* 13.1  HCT 35.2* 39.3  MCV 94.6 94.5  PLT 172 189    Cardiac Enzymes: No results for input(s): CKTOTAL, CKMB, CKMBINDEX, TROPONINI in the last 168 hours.  BNP (last 3 results) Recent Labs    01/16/21 0917 01/23/21 0628 01/27/21 0245  BNP 69.3 40.3 21.4    ProBNP (last 3 results) No results  for input(s): PROBNP in the last 8760 hours.  Radiological Exams: DG CHEST PORT 1 VIEW  Result Date: 02/15/2021 CLINICAL DATA:  Follow-up pleural effusion EXAM: PORTABLE CHEST 1 VIEW COMPARISON:  Chest x-ray 2 days ago FINDINGS: Stable coarse interstitial opacities with low lung volumes. Cardiomegaly. No visible effusion or pneumothorax. Artifact from EKG leads. IMPRESSION: Stable diffuse infiltrates and low lung volumes. Electronically Signed   By: Tiburcio Pea M.D.   On: 02/15/2021 07:41    Assessment/Plan Active Problems:   Multifocal pneumonia   Acute on chronic respiratory failure with hypoxia (HCC)   Acute respiratory distress syndrome (ARDS) due to COVID-19 virus (HCC)   COVID-19 virus infection   Acute on chronic respiratory failure with hypoxia we will continue on high flow oxygen 35 L and 60% FiO2.  With her saturations being 97% spoke with respiratory therapy to try to do some weaning Multifocal pneumonia secondary to COVID-19 severe disease we will continue to follow along closely ARDS again supportive care last CT was on November 30 showing groundglass opacities and diffuse changes COVID-19 virus infection in recovery   I have personally seen and evaluated the patient, evaluated laboratory and imaging results, formulated the assessment and plan and placed orders. The Patient requires high complexity decision making with multiple systems involvement.  Rounds were done with the Respiratory Therapy Director and Staff therapists and discussed with nursing staff also.  Yevonne Pax, MD [facility] Pulmonary Critical Care Medicine Sleep Medicine

## 2021-02-17 LAB — BASIC METABOLIC PANEL
Anion gap: 7 (ref 5–15)
BUN: 20 mg/dL (ref 8–23)
CO2: 27 mmol/L (ref 22–32)
Calcium: 8.9 mg/dL (ref 8.9–10.3)
Chloride: 102 mmol/L (ref 98–111)
Creatinine, Ser: 0.68 mg/dL (ref 0.44–1.00)
GFR, Estimated: >60 mL/min (ref >60)
Glucose, Bld: 97 mg/dL (ref 70–99)
Potassium: 4 mmol/L (ref 3.5–5.1)
Sodium: 136 mmol/L (ref 135–145)

## 2021-02-17 NOTE — Progress Notes (Signed)
Pulmonary Critical Care Medicine [facility] [place]   PULMONARY CRITICAL CARE SERVICE  PROGRESS NOTE     Christina Higgins  [id number]  DOB: April 06, 1950   DOA: 01/07/2021  Referring Physician: Luna Kitchens, MD  HPI: Christina Higgins is a 70 y.o. female being followed for ventilator/airway/oxygen weaning Acute on Chronic Respiratory Failure.  Patient is currently on 40 L oxygen was increased to 80% overnight because of some desaturations.  Patient had been on 60% yesterday  Medications: Reviewed on Rounds  Physical Exam:  Vitals: Temperature is 98.2 pulse 67 respiratory is 20 blood pressure is 124/48 saturations 99%  Ventilator Settings on heated high flow and BiPAP at night  General: Comfortable at this time Neck: supple Cardiovascular: no malignant arrhythmias Respiratory: No rhonchi very coarse breath sounds Skin: no rash seen on limited exam Musculoskeletal: No gross abnormality Psychiatric:unable to assess Neurologic:no involuntary movements         Lab Data:   Basic Metabolic Panel: Recent Labs  Lab 02/17/21 0606  NA 136  K 4.0  CL 102  CO2 27  GLUCOSE 97  BUN 20  CREATININE 0.68  CALCIUM 8.9    ABG: No results for input(s): PHART, PCO2ART, PO2ART, HCO3, O2SAT in the last 168 hours.  Liver Function Tests: No results for input(s): AST, ALT, ALKPHOS, BILITOT, PROT, ALBUMIN in the last 168 hours. No results for input(s): LIPASE, AMYLASE in the last 168 hours. No results for input(s): AMMONIA in the last 168 hours.  CBC: Recent Labs  Lab 02/13/21 0435  WBC 8.2  HGB 13.1  HCT 39.3  MCV 94.5  PLT 189    Cardiac Enzymes: No results for input(s): CKTOTAL, CKMB, CKMBINDEX, TROPONINI in the last 168 hours.  BNP (last 3 results) Recent Labs    01/16/21 0917 01/23/21 0628 01/27/21 0245  BNP 69.3 40.3 21.4    ProBNP (last 3 results) No results for input(s): PROBNP in the last 8760 hours.  Radiological Exams: No  results found.  Assessment/Plan Active Problems:   Multifocal pneumonia   Acute on chronic respiratory failure with hypoxia (HCC)   Acute respiratory distress syndrome (ARDS) due to COVID-19 virus (HCC)   COVID-19 virus infection   Acute on chronic respiratory failure hypoxia the plan is going to try to wean the oxygen back down patient had to be bumped up to 80% but saturations look good today Multifocal pneumonia no change we will continue to monitor along closely. ARDS slow to improve we will continue to follow along COVID-19 virus infection in recovery    I have personally seen and evaluated the patient, evaluated laboratory and imaging results, formulated the assessment and plan and placed orders. The Patient requires high complexity decision making with multiple systems involvement.  Rounds were done with the Respiratory Therapy Director and Staff therapists and discussed with nursing staff also.  Yevonne Pax, MD [facility] Pulmonary Critical Care Medicine Sleep Medicine

## 2021-02-18 ENCOUNTER — Other Ambulatory Visit (HOSPITAL_COMMUNITY): Payer: Self-pay

## 2021-02-18 NOTE — Progress Notes (Signed)
Pulmonary Critical Care Medicine [facility] [place]   PULMONARY CRITICAL CARE SERVICE  PROGRESS NOTE     Christina Higgins  [id number]  DOB: 1950-05-31   DOA: 01/09/2021  Referring Physician: Luna Kitchens, MD  HPI: Christina Higgins is a 70 y.o. female being followed for ventilator/airway/oxygen weaning Acute on Chronic Respiratory Failure.  Patient is comfortable right now without distress has been afebrile  Medications: Reviewed on Rounds  Physical Exam:  Vitals: Temperature is 97.0 pulse 60 respiratory rate is 18 blood pressure is 101/60 saturations 99%  Ventilator Settings patient is off the ventilator but on high flow 40 L and 80% FiO2  General: Comfortable at this time Neck: supple Cardiovascular: no malignant arrhythmias Respiratory: Scattered rhonchi are noted bilaterally Skin: no rash seen on limited exam Musculoskeletal: No gross abnormality Psychiatric:unable to assess Neurologic:no involuntary movements         Lab Data:   Basic Metabolic Panel: Recent Labs  Lab 02/17/21 0606  NA 136  K 4.0  CL 102  CO2 27  GLUCOSE 97  BUN 20  CREATININE 0.68  CALCIUM 8.9    ABG: No results for input(s): PHART, PCO2ART, PO2ART, HCO3, O2SAT in the last 168 hours.  Liver Function Tests: No results for input(s): AST, ALT, ALKPHOS, BILITOT, PROT, ALBUMIN in the last 168 hours. No results for input(s): LIPASE, AMYLASE in the last 168 hours. No results for input(s): AMMONIA in the last 168 hours.  CBC: Recent Labs  Lab 02/13/21 0435  WBC 8.2  HGB 13.1  HCT 39.3  MCV 94.5  PLT 189    Cardiac Enzymes: No results for input(s): CKTOTAL, CKMB, CKMBINDEX, TROPONINI in the last 168 hours.  BNP (last 3 results) Recent Labs    01/16/21 0917 01/23/21 0628 01/27/21 0245  BNP 69.3 40.3 21.4    ProBNP (last 3 results) No results for input(s): PROBNP in the last 8760 hours.  Radiological Exams: No results  found.  Assessment/Plan Active Problems:   Multifocal pneumonia   Acute on chronic respiratory failure with hypoxia (HCC)   Acute respiratory distress syndrome (ARDS) due to COVID-19 virus (HCC)   COVID-19 virus infection   Acute on chronic respiratory failure hypoxia patient's oxygen requirements have gone up slightly.  Looked at the previous x-ray shows perhaps a little bit more heart failure so I ordered a follow-up chest film now might benefit from diuresis. Multifocal pneumonia has been treated we will continue to monitor along closely ARDS patient is very slow to improve COVID-19 virus infection in recovery    I have personally seen and evaluated the patient, evaluated laboratory and imaging results, formulated the assessment and plan and placed orders. The Patient requires high complexity decision making with multiple systems involvement.  Rounds were done with the Respiratory Therapy Director and Staff therapists and discussed with nursing staff also.  Yevonne Pax, MD [facility] Pulmonary Critical Care Medicine Sleep Medicine

## 2021-02-19 NOTE — Progress Notes (Signed)
Pulmonary Critical Care Medicine [facility] [place]   PULMONARY CRITICAL CARE SERVICE  PROGRESS NOTE     TEILA SKALSKY  [id number]  DOB: 10-19-1950   DOA: 02-25-2021  Referring Physician: Luna Kitchens, MD  HPI: AYANE DELANCEY is a 70 y.o. female being followed for ventilator/airway/oxygen weaning Acute on Chronic Respiratory Failure.  Patient is on heated high flow oxygen the oxygen was turned back down to 60% today she looks good  Medications: Reviewed on Rounds  Physical Exam:  Vitals: Temperature is 97.0 pulse 56 respiratory is 29 blood pressure is 114/63 saturations 99%  Ventilator Settings on heated high flow FiO2 60% with 35 L flow rate  General: Comfortable at this time Neck: supple Cardiovascular: no malignant arrhythmias Respiratory: No rhonchi very coarse breath sounds Skin: no rash seen on limited exam Musculoskeletal: No gross abnormality Psychiatric:unable to assess Neurologic:no involuntary movements         Lab Data:   Basic Metabolic Panel: Recent Labs  Lab 02/17/21 0606  NA 136  K 4.0  CL 102  CO2 27  GLUCOSE 97  BUN 20  CREATININE 0.68  CALCIUM 8.9    ABG: No results for input(s): PHART, PCO2ART, PO2ART, HCO3, O2SAT in the last 168 hours.  Liver Function Tests: No results for input(s): AST, ALT, ALKPHOS, BILITOT, PROT, ALBUMIN in the last 168 hours. No results for input(s): LIPASE, AMYLASE in the last 168 hours. No results for input(s): AMMONIA in the last 168 hours.  CBC: Recent Labs  Lab 02/13/21 0435  WBC 8.2  HGB 13.1  HCT 39.3  MCV 94.5  PLT 189    Cardiac Enzymes: No results for input(s): CKTOTAL, CKMB, CKMBINDEX, TROPONINI in the last 168 hours.  BNP (last 3 results) Recent Labs    01/16/21 0917 01/23/21 0628 01/27/21 0245  BNP 69.3 40.3 21.4    ProBNP (last 3 results) No results for input(s): PROBNP in the last 8760 hours.  Radiological Exams: DG Chest Port 1 View  Result  Date: 02/18/2021 CLINICAL DATA:  Shortness of breath.  CHF EXAM: PORTABLE CHEST 1 VIEW COMPARISON:  02/15/2021 FINDINGS: Midline trachea. Borderline cardiomegaly. No pleural effusion or pneumothorax. Left greater than right and lower lung predominant interstitial and airspace opacities are similar. IMPRESSION: No significant change in multifocal interstitial and airspace disease. Given lack of significant pleural fluid, favor infection over pulmonary edema. Electronically Signed   By: Jeronimo Greaves M.D.   On: 02/18/2021 11:46    Assessment/Plan Active Problems:   Multifocal pneumonia   Acute on chronic respiratory failure with hypoxia (HCC)   Acute respiratory distress syndrome (ARDS) due to COVID-19 virus (HCC)   COVID-19 virus infection   Acute on chronic respiratory failure hypoxia plan is going to be to continue with the oxygen therapy BiPAP at nighttime Multifocal pneumonia has been very slow to improve we will continue to follow along ARDS again very slow to improve continue to follow along COVID-19 virus infection in recovery phase   I have personally seen and evaluated the patient, evaluated laboratory and imaging results, formulated the assessment and plan and placed orders. The Patient requires high complexity decision making with multiple systems involvement.  Rounds were done with the Respiratory Therapy Director and Staff therapists and discussed with nursing staff also.  Yevonne Pax, MD [facility] Pulmonary Critical Care Medicine Sleep Medicine

## 2021-02-20 NOTE — Progress Notes (Signed)
Pulmonary Critical Care Medicine [facility] [place]   PULMONARY CRITICAL CARE SERVICE  PROGRESS NOTE     Christina Higgins  [id number]  DOB: 1950-07-22   DOA: 02/08/2021  Referring Physician: Luna Kitchens, MD  HPI: Christina Higgins is a 70 y.o. female being followed for ventilator/airway/oxygen weaning Acute on Chronic Respiratory Failure.  Her oxygen requirements are down patient is on heated high flow oxygen 35 L and 55% FiO2  Medications: Reviewed on Rounds  Physical Exam:  Vitals: Temperature is 97.5 pulse 72 respiratory is 40 blood pressure is 107/66 saturations 100%  Ventilator Settings currently off the ventilator on heated high flow  General: Comfortable at this time Neck: supple Cardiovascular: no malignant arrhythmias Respiratory: No rhonchi no rales are noted at this time Skin: no rash seen on limited exam Musculoskeletal: No gross abnormality Psychiatric:unable to assess Neurologic:no involuntary movements         Lab Data:   Basic Metabolic Panel: Recent Labs  Lab 02/17/21 0606  NA 136  K 4.0  CL 102  CO2 27  GLUCOSE 97  BUN 20  CREATININE 0.68  CALCIUM 8.9    ABG: No results for input(s): PHART, PCO2ART, PO2ART, HCO3, O2SAT in the last 168 hours.  Liver Function Tests: No results for input(s): AST, ALT, ALKPHOS, BILITOT, PROT, ALBUMIN in the last 168 hours. No results for input(s): LIPASE, AMYLASE in the last 168 hours. No results for input(s): AMMONIA in the last 168 hours.  CBC: No results for input(s): WBC, NEUTROABS, HGB, HCT, MCV, PLT in the last 168 hours.  Cardiac Enzymes: No results for input(s): CKTOTAL, CKMB, CKMBINDEX, TROPONINI in the last 168 hours.  BNP (last 3 results) Recent Labs    01/16/21 0917 01/23/21 0628 01/27/21 0245  BNP 69.3 40.3 21.4    ProBNP (last 3 results) No results for input(s): PROBNP in the last 8760 hours.  Radiological Exams: DG Chest Port 1 View  Result Date:  02/18/2021 CLINICAL DATA:  Shortness of breath.  CHF EXAM: PORTABLE CHEST 1 VIEW COMPARISON:  02/15/2021 FINDINGS: Midline trachea. Borderline cardiomegaly. No pleural effusion or pneumothorax. Left greater than right and lower lung predominant interstitial and airspace opacities are similar. IMPRESSION: No significant change in multifocal interstitial and airspace disease. Given lack of significant pleural fluid, favor infection over pulmonary edema. Electronically Signed   By: Jeronimo Greaves M.D.   On: 02/18/2021 11:46    Assessment/Plan Active Problems:   Multifocal pneumonia   Acute on chronic respiratory failure with hypoxia (HCC)   Acute respiratory distress syndrome (ARDS) due to COVID-19 virus (HCC)   COVID-19 virus infection   Acute on chronic respiratory failure with hypoxia we will continue with oxygen therapy titrate as tolerated patient is down to 55% Multifocal pneumonia has been treated with antibiotics continue to follow follow-up x-ray was done COVID-19 virus infection in recovery ARDS patient is at baseline    I have personally seen and evaluated the patient, evaluated laboratory and imaging results, formulated the assessment and plan and placed orders. The Patient requires high complexity decision making with multiple systems involvement.  Rounds were done with the Respiratory Therapy Director and Staff therapists and discussed with nursing staff also.  Yevonne Pax, MD [facility] Pulmonary Critical Care Medicine Sleep Medicine

## 2021-02-21 LAB — CBC
HCT: 38.4 % (ref 36.0–46.0)
Hemoglobin: 12.4 g/dL (ref 12.0–15.0)
MCH: 30.6 pg (ref 26.0–34.0)
MCHC: 32.3 g/dL (ref 30.0–36.0)
MCV: 94.8 fL (ref 80.0–100.0)
Platelets: 128 10*3/uL — ABNORMAL LOW (ref 150–400)
RBC: 4.05 MIL/uL (ref 3.87–5.11)
RDW: 15.7 % — ABNORMAL HIGH (ref 11.5–15.5)
WBC: 7.2 10*3/uL (ref 4.0–10.5)
nRBC: 0 % (ref 0.0–0.2)

## 2021-02-21 LAB — BASIC METABOLIC PANEL
Anion gap: 7 (ref 5–15)
BUN: 23 mg/dL (ref 8–23)
CO2: 27 mmol/L (ref 22–32)
Calcium: 9.2 mg/dL (ref 8.9–10.3)
Chloride: 104 mmol/L (ref 98–111)
Creatinine, Ser: 0.67 mg/dL (ref 0.44–1.00)
GFR, Estimated: >60 mL/min (ref >60)
Glucose, Bld: 129 mg/dL — ABNORMAL HIGH (ref 70–99)
Potassium: 4.1 mmol/L (ref 3.5–5.1)
Sodium: 138 mmol/L (ref 135–145)

## 2021-02-21 LAB — MAGNESIUM: Magnesium: 2.2 mg/dL (ref 1.7–2.4)

## 2021-02-21 NOTE — Progress Notes (Signed)
Pulmonary Critical Care Medicine [facility] [place]   PULMONARY CRITICAL CARE SERVICE  PROGRESS NOTE     Christina Higgins  [id number]  DOB: Jan 14, 1951   DOA: 2021/02/10  Referring Physician: Luna Kitchens, MD  HPI: Christina Higgins is a 70 y.o. female being followed for ventilator/airway/oxygen weaning Acute on Chronic Respiratory Failure.  Patient is on high flow oxygen currently on 35 L and 50% FiO2  Medications: Reviewed on Rounds  Physical Exam:  Vitals: Temperature is 96.7 pulse 64 respiratory to is 22 blood pressure is 115/72 saturations 95%  Ventilator Settings on heated high flow 35 L and 50%  General: Comfortable at this time Neck: supple Cardiovascular: no malignant arrhythmias Respiratory: Scattered rhonchi expansion is equal Skin: no rash seen on limited exam Musculoskeletal: No gross abnormality Psychiatric:unable to assess Neurologic:no involuntary movements         Lab Data:   Basic Metabolic Panel: Recent Labs  Lab 02/17/21 0606 02/21/21 0410  NA 136 138  K 4.0 4.1  CL 102 104  CO2 27 27  GLUCOSE 97 129*  BUN 20 23  CREATININE 0.68 0.67  CALCIUM 8.9 9.2  MG  --  2.2    ABG: No results for input(s): PHART, PCO2ART, PO2ART, HCO3, O2SAT in the last 168 hours.  Liver Function Tests: No results for input(s): AST, ALT, ALKPHOS, BILITOT, PROT, ALBUMIN in the last 168 hours. No results for input(s): LIPASE, AMYLASE in the last 168 hours. No results for input(s): AMMONIA in the last 168 hours.  CBC: Recent Labs  Lab 02/21/21 0410  WBC 7.2  HGB 12.4  HCT 38.4  MCV 94.8  PLT 128*    Cardiac Enzymes: No results for input(s): CKTOTAL, CKMB, CKMBINDEX, TROPONINI in the last 168 hours.  BNP (last 3 results) Recent Labs    01/16/21 0917 01/23/21 0628 01/27/21 0245  BNP 69.3 40.3 21.4    ProBNP (last 3 results) No results for input(s): PROBNP in the last 8760 hours.  Radiological Exams: No results  found.  Assessment/Plan Active Problems:   Multifocal pneumonia   Acute on chronic respiratory failure with hypoxia (HCC)   Acute respiratory distress syndrome (ARDS) due to COVID-19 virus (HCC)   COVID-19 virus infection   Acute on chronic respiratory failure with hypoxia we will continue to slowly decrease the FiO2 requirements Multifocal pneumonia treated slow improvement ARDS slow improvement we will continue to follow COVID-19 virus infection in recovery    I have personally seen and evaluated the patient, evaluated laboratory and imaging results, formulated the assessment and plan and placed orders. The Patient requires high complexity decision making with multiple systems involvement.  Rounds were done with the Respiratory Therapy Director and Staff therapists and discussed with nursing staff also.  Yevonne Pax, MD [facility] Pulmonary Critical Care Medicine Sleep Medicine

## 2021-02-22 LAB — BLOOD GAS, ARTERIAL
Acid-Base Excess: 1.4 mmol/L (ref 0.0–2.0)
Bicarbonate: 24.7 mmol/L (ref 20.0–28.0)
Drawn by: 164
FIO2: 50
O2 Saturation: 94.9 %
Patient temperature: 37
pCO2 arterial: 34.2 mmHg (ref 32.0–48.0)
pH, Arterial: 7.473 — ABNORMAL HIGH (ref 7.350–7.450)
pO2, Arterial: 70.3 mmHg — ABNORMAL LOW (ref 83.0–108.0)

## 2021-02-22 NOTE — Progress Notes (Signed)
Pulmonary Critical Care Medicine [facility] [place]   PULMONARY CRITICAL CARE SERVICE  PROGRESS NOTE     Christina Higgins  [id number]  DOB: 04/26/1950   DOA: 12/31/2020  Referring Physician: Luna Kitchens, MD  HPI: Christina Higgins is a 70 y.o. female being followed for ventilator/airway/oxygen weaning Acute on Chronic Respiratory Failure.  Patient is on 30 L oxygen and 50% FiO2  Medications: Reviewed on Rounds  Physical Exam:  Vitals: Temperature 97.0 pulse 70 respiratory rate is 25 blood pressure is 94/56 saturations 100%  Ventilator Settings patient is on 30 L high flow oxygen levels decreased down to 50%  General: Comfortable at this time Neck: supple Cardiovascular: no malignant arrhythmias Respiratory: Scattered rhonchi very coarse breath sounds Skin: no rash seen on limited exam Musculoskeletal: No gross abnormality Psychiatric:unable to assess Neurologic:no involuntary movements         Lab Data:   Basic Metabolic Panel: Recent Labs  Lab 02/17/21 0606 02/21/21 0410  NA 136 138  K 4.0 4.1  CL 102 104  CO2 27 27  GLUCOSE 97 129*  BUN 20 23  CREATININE 0.68 0.67  CALCIUM 8.9 9.2  MG  --  2.2    ABG: No results for input(s): PHART, PCO2ART, PO2ART, HCO3, O2SAT in the last 168 hours.  Liver Function Tests: No results for input(s): AST, ALT, ALKPHOS, BILITOT, PROT, ALBUMIN in the last 168 hours. No results for input(s): LIPASE, AMYLASE in the last 168 hours. No results for input(s): AMMONIA in the last 168 hours.  CBC: Recent Labs  Lab 02/21/21 0410  WBC 7.2  HGB 12.4  HCT 38.4  MCV 94.8  PLT 128*    Cardiac Enzymes: No results for input(s): CKTOTAL, CKMB, CKMBINDEX, TROPONINI in the last 168 hours.  BNP (last 3 results) Recent Labs    01/16/21 0917 01/23/21 0628 01/27/21 0245  BNP 69.3 40.3 21.4    ProBNP (last 3 results) No results for input(s): PROBNP in the last 8760 hours.  Radiological Exams: No  results found.  Assessment/Plan Active Problems:   Multifocal pneumonia   Acute on chronic respiratory failure with hypoxia (HCC)   Acute respiratory distress syndrome (ARDS) due to COVID-19 virus (HCC)   COVID-19 virus infection   Acute on chronic respiratory failure hypoxia we will continue to slowly wean the FiO2 down as tolerated Multifocal pneumonia very slow to improve COVID-19 virus infection in recovery phase ARDS slow to improve    I have personally seen and evaluated the patient, evaluated laboratory and imaging results, formulated the assessment and plan and placed orders. The Patient requires high complexity decision making with multiple systems involvement.  Rounds were done with the Respiratory Therapy Director and Staff therapists and discussed with nursing staff also.  Yevonne Pax, MD [facility] Pulmonary Critical Care Medicine Sleep Medicine

## 2021-02-23 NOTE — Progress Notes (Signed)
Pulmonary Critical Care Medicine [facility] [place]   PULMONARY CRITICAL CARE SERVICE  PROGRESS NOTE     Christina Higgins  [id number]  DOB: 23-Feb-1951   DOA: 01/17/2021  Referring Physician: Luna Kitchens, MD  HPI: Christina Higgins is a 70 y.o. female being followed for ventilator/airway/oxygen weaning Acute on Chronic Respiratory Failure.  Oxygen levels are down to 20 L flow rate 45% FiO2 saturations are good.  Discussed extensively with the patient's husband and patient as well as respiratory therapy during rounds.  We will going to try to see about dropping her flow rate further so that we can possibly get her to an Oxymizer  Medications: Reviewed on Rounds  Physical Exam:  Vitals: Temperature is 96.4 pulse of 70 respiratory 24 blood pressure is 103/68 saturations 97%  Ventilator Settings on 20 L and 45%  General: Comfortable at this time Neck: supple Cardiovascular: no malignant arrhythmias Respiratory: Coarse rhonchi expansion equal Skin: no rash seen on limited exam Musculoskeletal: No gross abnormality Psychiatric:unable to assess Neurologic:no involuntary movements         Lab Data:   Basic Metabolic Panel: Recent Labs  Lab 02/17/21 0606 02/21/21 0410  NA 136 138  K 4.0 4.1  CL 102 104  CO2 27 27  GLUCOSE 97 129*  BUN 20 23  CREATININE 0.68 0.67  CALCIUM 8.9 9.2  MG  --  2.2    ABG: Recent Labs  Lab 02/22/21 1214  PHART 7.473*  PCO2ART 34.2  PO2ART 70.3*  HCO3 24.7  O2SAT 94.9    Liver Function Tests: No results for input(s): AST, ALT, ALKPHOS, BILITOT, PROT, ALBUMIN in the last 168 hours. No results for input(s): LIPASE, AMYLASE in the last 168 hours. No results for input(s): AMMONIA in the last 168 hours.  CBC: Recent Labs  Lab 02/21/21 0410  WBC 7.2  HGB 12.4  HCT 38.4  MCV 94.8  PLT 128*    Cardiac Enzymes: No results for input(s): CKTOTAL, CKMB, CKMBINDEX, TROPONINI in the last 168 hours.  BNP  (last 3 results) Recent Labs    01/16/21 0917 01/23/21 0628 01/27/21 0245  BNP 69.3 40.3 21.4    ProBNP (last 3 results) No results for input(s): PROBNP in the last 8760 hours.  Radiological Exams: No results found.  Assessment/Plan Active Problems:   Multifocal pneumonia   Acute on chronic respiratory failure with hypoxia (HCC)   Acute respiratory distress syndrome (ARDS) due to COVID-19 virus (HCC)   COVID-19 virus infection   Acute on chronic respiratory failure hypoxia plan is going to be to wean FiO2 down to 15 L if possible with a goal of possibly doing an Oxymizer trial Multifocal pneumonia has been treated with antibiotics very slow to improve COVID-19 virus infection in recovery phase we will continue to monitor ARDS slow improvement we will continue to follow along closely.    I have personally seen and evaluated the patient, evaluated laboratory and imaging results, formulated the assessment and plan and placed orders. The Patient requires high complexity decision making with multiple systems involvement.  Rounds were done with the Respiratory Therapy Director and Staff therapists and discussed with nursing staff also.  Yevonne Pax, MD [facility] Pulmonary Critical Care Medicine Sleep Medicine

## 2021-02-24 LAB — CBC
HCT: 38.6 % (ref 36.0–46.0)
Hemoglobin: 12.8 g/dL (ref 12.0–15.0)
MCH: 31.2 pg (ref 26.0–34.0)
MCHC: 33.2 g/dL (ref 30.0–36.0)
MCV: 94.1 fL (ref 80.0–100.0)
Platelets: 156 10*3/uL (ref 150–400)
RBC: 4.1 MIL/uL (ref 3.87–5.11)
RDW: 15.8 % — ABNORMAL HIGH (ref 11.5–15.5)
WBC: 6.6 10*3/uL (ref 4.0–10.5)
nRBC: 0 % (ref 0.0–0.2)

## 2021-02-24 LAB — BASIC METABOLIC PANEL
Anion gap: 10 (ref 5–15)
BUN: 22 mg/dL (ref 8–23)
CO2: 26 mmol/L (ref 22–32)
Calcium: 9 mg/dL (ref 8.9–10.3)
Chloride: 102 mmol/L (ref 98–111)
Creatinine, Ser: 0.73 mg/dL (ref 0.44–1.00)
GFR, Estimated: >60 mL/min (ref >60)
Glucose, Bld: 117 mg/dL — ABNORMAL HIGH (ref 70–99)
Potassium: 3.7 mmol/L (ref 3.5–5.1)
Sodium: 138 mmol/L (ref 135–145)

## 2021-02-24 LAB — MAGNESIUM: Magnesium: 2 mg/dL (ref 1.7–2.4)

## 2021-02-24 NOTE — Progress Notes (Signed)
Pulmonary Critical Care Medicine [facility] [place]   PULMONARY CRITICAL CARE SERVICE  PROGRESS NOTE     Christina Higgins  GUR:[id number]  DOB: June 25, 1950   DOA: 01/08/2021  Referring Physician: Luna Kitchens, MD  HPI: Christina Higgins is a 70 y.o. female being followed for ventilator/airway/oxygen weaning Acute on Chronic Respiratory Failure.  Patient's oxygen was increased again because of desaturations with movement  Medications: Reviewed on Rounds  Physical Exam:  Vitals: Temperature is 97.6 pulse 80 respiratory is 20 blood pressure is 115/76 saturations 95%  Ventilator Settings on 30 L flow with an FiO2 of 65%  General: Comfortable at this time Neck: supple Cardiovascular: no malignant arrhythmias Respiratory: Coarse rhonchi expansion is equal Skin: no rash seen on limited exam Musculoskeletal: No gross abnormality Psychiatric:unable to assess Neurologic:no involuntary movements         Lab Data:   Basic Metabolic Panel: Recent Labs  Lab 02/21/21 0410 02/24/21 0645  NA 138 138  K 4.1 3.7  CL 104 102  CO2 27 26  GLUCOSE 129* 117*  BUN 23 22  CREATININE 0.67 0.73  CALCIUM 9.2 9.0  MG 2.2 2.0    ABG: Recent Labs  Lab 02/22/21 1214  PHART 7.473*  PCO2ART 34.2  PO2ART 70.3*  HCO3 24.7  O2SAT 94.9    Liver Function Tests: No results for input(s): AST, ALT, ALKPHOS, BILITOT, PROT, ALBUMIN in the last 168 hours. No results for input(s): LIPASE, AMYLASE in the last 168 hours. No results for input(s): AMMONIA in the last 168 hours.  CBC: Recent Labs  Lab 02/21/21 0410 02/24/21 0645  WBC 7.2 6.6  HGB 12.4 12.8  HCT 38.4 38.6  MCV 94.8 94.1  PLT 128* 156    Cardiac Enzymes: No results for input(s): CKTOTAL, CKMB, CKMBINDEX, TROPONINI in the last 168 hours.  BNP (last 3 results) Recent Labs    01/16/21 0917 01/23/21 0628 01/27/21 0245  BNP 69.3 40.3 21.4    ProBNP (last 3 results) No results for input(s):  PROBNP in the last 8760 hours.  Radiological Exams: No results found.  Assessment/Plan Active Problems:   Multifocal pneumonia   Acute on chronic respiratory failure with hypoxia (HCC)   Acute respiratory distress syndrome (ARDS) due to COVID-19 virus (HCC)   COVID-19 virus infection   Acute on chronic respiratory failure with hypoxia plan is to continue with the high flow oxygen titrate as tolerated we will continue with secretion management supportive care. Multifocal pneumonia treated we will continue to follow along closely. COVID-19 virus infection in recovery phase ARDS slow improvement    I have personally seen and evaluated the patient, evaluated laboratory and imaging results, formulated the assessment and plan and placed orders. The Patient requires high complexity decision making with multiple systems involvement.  Rounds were done with the Respiratory Therapy Director and Staff therapists and discussed with nursing staff also.  Yevonne Pax, MD [facility] Pulmonary Critical Care Medicine Sleep Medicine

## 2021-02-25 NOTE — Progress Notes (Signed)
Pulmonary Critical Care Medicine [facility] [place]   PULMONARY CRITICAL CARE SERVICE  PROGRESS NOTE     Christina Higgins  RSW:[id number]  DOB: 1950/03/11   DOA: 01/14/2021  Referring Physician: Luna Kitchens, MD  HPI: Christina Higgins is a 70 y.o. female being followed for ventilator/airway/oxygen weaning Acute on Chronic Respiratory Failure.  Patient is currently on 10 L oxygen with the Oxymizer.  Saturations are better  Medications: Reviewed on Rounds  Physical Exam:  Vitals: Temperature is 96.7 pulse 72 respiratory 33 blood pressure is 98/62 saturations 95%  Ventilator Settings off the ventilator on Oxymizer 10 L  General: Comfortable at this time Neck: supple Cardiovascular: no malignant arrhythmias Respiratory: Scattered rhonchi expansion is equal Skin: no rash seen on limited exam Musculoskeletal: No gross abnormality Psychiatric:unable to assess Neurologic:no involuntary movements         Lab Data:   Basic Metabolic Panel: Recent Labs  Lab 02/21/21 0410 02/24/21 0645  NA 138 138  K 4.1 3.7  CL 104 102  CO2 27 26  GLUCOSE 129* 117*  BUN 23 22  CREATININE 0.67 0.73  CALCIUM 9.2 9.0  MG 2.2 2.0    ABG: Recent Labs  Lab 02/22/21 1214  PHART 7.473*  PCO2ART 34.2  PO2ART 70.3*  HCO3 24.7  O2SAT 94.9    Liver Function Tests: No results for input(s): AST, ALT, ALKPHOS, BILITOT, PROT, ALBUMIN in the last 168 hours. No results for input(s): LIPASE, AMYLASE in the last 168 hours. No results for input(s): AMMONIA in the last 168 hours.  CBC: Recent Labs  Lab 02/21/21 0410 02/24/21 0645  WBC 7.2 6.6  HGB 12.4 12.8  HCT 38.4 38.6  MCV 94.8 94.1  PLT 128* 156    Cardiac Enzymes: No results for input(s): CKTOTAL, CKMB, CKMBINDEX, TROPONINI in the last 168 hours.  BNP (last 3 results) Recent Labs    01/16/21 0917 01/23/21 0628 01/27/21 0245  BNP 69.3 40.3 21.4    ProBNP (last 3 results) No results for input(s):  PROBNP in the last 8760 hours.  Radiological Exams: No results found.  Assessment/Plan Active Problems:   Multifocal pneumonia   Acute on chronic respiratory failure with hypoxia (HCC)   Acute respiratory distress syndrome (ARDS) due to COVID-19 virus (HCC)   COVID-19 virus infection   Acute on chronic respiratory failure with hypoxia patient will be continued on the Oxymizer titrate as tolerated saturations are maintaining right now Multifocal pneumonia has been treated very slow to improve COVID-19 virus infection in recovery we will continue to follow along closely. ARDS showing slow improvement    I have personally seen and evaluated the patient, evaluated laboratory and imaging results, formulated the assessment and plan and placed orders. The Patient requires high complexity decision making with multiple systems involvement.  Rounds were done with the Respiratory Therapy Director and Staff therapists and discussed with nursing staff also.  Yevonne Pax, MD [facility] Pulmonary Critical Care Medicine Sleep Medicine

## 2021-02-26 NOTE — Progress Notes (Signed)
Pulmonary Critical Care Medicine [facility] [place]   PULMONARY CRITICAL CARE SERVICE  PROGRESS NOTE     Christina Higgins  [id number]:[id number]  DOB: 04/04/1950   DOA: 2021/01/30  Referring Physician: Luna Kitchens, MD  HPI: Christina Higgins is a 70 y.o. female being followed for ventilator/airway/oxygen weaning Acute on Chronic Respiratory Failure.  Patient is on 6 L Oxymizer has been actually doing fairly well with less desaturation  Medications: Reviewed on Rounds  Physical Exam:  Vitals: Temperature is 96.7 pulse 72 respiratory is 30 blood pressure is 101/55 saturations 94%  Ventilator Settings on 6 L Oxymizer  General: Comfortable at this time Neck: supple Cardiovascular: no malignant arrhythmias Respiratory: Scattered rhonchi expansion is equal at this time Skin: no rash seen on limited exam Musculoskeletal: No gross abnormality Psychiatric:unable to assess Neurologic:no involuntary movements         Lab Data:   Basic Metabolic Panel: Recent Labs  Lab 02/21/21 0410 02/24/21 0645  NA 138 138  K 4.1 3.7  CL 104 102  CO2 27 26  GLUCOSE 129* 117*  BUN 23 22  CREATININE 0.67 0.73  CALCIUM 9.2 9.0  MG 2.2 2.0    ABG: Recent Labs  Lab 02/22/21 1214  PHART 7.473*  PCO2ART 34.2  PO2ART 70.3*  HCO3 24.7  O2SAT 94.9    Liver Function Tests: No results for input(s): AST, ALT, ALKPHOS, BILITOT, PROT, ALBUMIN in the last 168 hours. No results for input(s): LIPASE, AMYLASE in the last 168 hours. No results for input(s): AMMONIA in the last 168 hours.  CBC: Recent Labs  Lab 02/21/21 0410 02/24/21 0645  WBC 7.2 6.6  HGB 12.4 12.8  HCT 38.4 38.6  MCV 94.8 94.1  PLT 128* 156    Cardiac Enzymes: No results for input(s): CKTOTAL, CKMB, CKMBINDEX, TROPONINI in the last 168 hours.  BNP (last 3 results) Recent Labs    01/16/21 0917 01/23/21 0628 01/27/21 0245  BNP 69.3 40.3 21.4    ProBNP (last 3 results) No results for  input(s): PROBNP in the last 8760 hours.  Radiological Exams: No results found.  Assessment/Plan Active Problems:   Multifocal pneumonia   Acute on chronic respiratory failure with hypoxia (HCC)   Acute respiratory distress syndrome (ARDS) due to COVID-19 virus (HCC)   COVID-19 virus infection   Acute on chronic respiratory failure with hypoxia we will continue with the Oxymizer titrate as tolerated continue secretion management supportive care. Multifocal pneumonia supportive care we will continue to follow along closely ARDS secondary to COVID-19 slow to improve COVID-19 virus infection again slow to improve Oxygen dependent down now to 6 L Oxymizer   I have personally seen and evaluated the patient, evaluated laboratory and imaging results, formulated the assessment and plan and placed orders. The Patient requires high complexity decision making with multiple systems involvement.  Rounds were done with the Respiratory Therapy Director and Staff therapists and discussed with nursing staff also.  Yevonne Pax, MD [facility] Pulmonary Critical Care Medicine Sleep Medicine

## 2021-02-27 LAB — CBC
HCT: 36.7 % (ref 36.0–46.0)
Hemoglobin: 12 g/dL (ref 12.0–15.0)
MCH: 31.3 pg (ref 26.0–34.0)
MCHC: 32.7 g/dL (ref 30.0–36.0)
MCV: 95.6 fL (ref 80.0–100.0)
Platelets: 166 10*3/uL (ref 150–400)
RBC: 3.84 MIL/uL — ABNORMAL LOW (ref 3.87–5.11)
RDW: 16.3 % — ABNORMAL HIGH (ref 11.5–15.5)
WBC: 6 10*3/uL (ref 4.0–10.5)
nRBC: 0 % (ref 0.0–0.2)

## 2021-02-27 LAB — BASIC METABOLIC PANEL
Anion gap: 8 (ref 5–15)
BUN: 16 mg/dL (ref 8–23)
CO2: 29 mmol/L (ref 22–32)
Calcium: 9.1 mg/dL (ref 8.9–10.3)
Chloride: 98 mmol/L (ref 98–111)
Creatinine, Ser: 0.49 mg/dL (ref 0.44–1.00)
GFR, Estimated: >60 mL/min (ref >60)
Glucose, Bld: 113 mg/dL — ABNORMAL HIGH (ref 70–99)
Potassium: 4.1 mmol/L (ref 3.5–5.1)
Sodium: 135 mmol/L (ref 135–145)

## 2021-02-27 LAB — MAGNESIUM: Magnesium: 2.3 mg/dL (ref 1.7–2.4)

## 2021-02-27 NOTE — Progress Notes (Signed)
Pulmonary Critical Care Medicine [facility] [place]   PULMONARY CRITICAL CARE SERVICE  PROGRESS NOTE     Christina Higgins  LDJ:[id number]  DOB: [birth year]   DOA: 01/25/2021  Referring Physician: Luna Kitchens, MD  HPI: Christina Higgins is a 70 y.o. female being followed for ventilator/airway/oxygen weaning Acute on Chronic Respiratory Failure.  Patient has 5 L Oxymizer doing little bit better.  She has been working with therapy  Medications: Reviewed on Rounds  Physical Exam:  Vitals: Temperature is 97.1 pulse 63 respiratory 25 blood pressure is 117/55 saturations 94%  Ventilator Settings on 5 L Oxymizer  General: Comfortable at this time Neck: supple Cardiovascular: no malignant arrhythmias Respiratory: No rhonchi no rales are noted Skin: no rash seen on limited exam Musculoskeletal: No gross abnormality Psychiatric:unable to assess Neurologic:no involuntary movements         Lab Data:   Basic Metabolic Panel: Recent Labs  Lab 02/21/21 0410 02/24/21 0645 02/27/21 0600  NA 138 138 135  K 4.1 3.7 4.1  CL 104 102 98  CO2 27 26 29   GLUCOSE 129* 117* 113*  BUN 23 22 16   CREATININE 0.67 0.73 0.49  CALCIUM 9.2 9.0 9.1  MG 2.2 2.0 2.3    ABG: Recent Labs  Lab 02/22/21 1214  PHART 7.473*  PCO2ART 34.2  PO2ART 70.3*  HCO3 24.7  O2SAT 94.9    Liver Function Tests: No results for input(s): AST, ALT, ALKPHOS, BILITOT, PROT, ALBUMIN in the last 168 hours. No results for input(s): LIPASE, AMYLASE in the last 168 hours. No results for input(s): AMMONIA in the last 168 hours.  CBC: Recent Labs  Lab 02/21/21 0410 02/24/21 0645 02/27/21 0600  WBC 7.2 6.6 6.0  HGB 12.4 12.8 12.0  HCT 38.4 38.6 36.7  MCV 94.8 94.1 95.6  PLT 128* 156 166    Cardiac Enzymes: No results for input(s): CKTOTAL, CKMB, CKMBINDEX, TROPONINI in the last 168 hours.  BNP (last 3 results) Recent Labs    01/16/21 0917 01/23/21 0628 01/27/21 0245  BNP  69.3 40.3 21.4    ProBNP (last 3 results) No results for input(s): PROBNP in the last 8760 hours.  Radiological Exams: No results found.  Assessment/Plan Active Problems:   Multifocal pneumonia   Acute on chronic respiratory failure with hypoxia (HCC)   Acute respiratory distress syndrome (ARDS) due to COVID-19 virus (HCC)   COVID-19 virus infection   Acute on chronic respiratory failure hypoxia we will continue with titrating oxygen as tolerated continue secretion management pulmonary toilet. Multifocal pneumonia has been treated with antibiotics ARDS supportive care we will continue to follow along closely COVID-19 virus infection in resolution    I have personally seen and evaluated the patient, evaluated laboratory and imaging results, formulated the assessment and plan and placed orders. The Patient requires high complexity decision making with multiple systems involvement.  Rounds were done with the Respiratory Therapy Director and Staff therapists and discussed with nursing staff also.  01/25/21, MD [facility] Pulmonary Critical Care Medicine Sleep Medicine

## 2021-02-28 NOTE — Progress Notes (Signed)
Pulmonary Critical Care Medicine [facility] [place]   PULMONARY CRITICAL CARE SERVICE  PROGRESS NOTE     Christina Higgins  ALP:[id number]  DOB: 07/19/1950   DOA: 02/03/2021  Referring Physician: Luna Kitchens, MD  HPI: Christina Higgins is a 70 y.o. female being followed for ventilator/airway/oxygen weaning Acute on Chronic Respiratory Failure.  Patient is on 6 L Oxymizer has been doing well.  Medications: Reviewed on Rounds  Physical Exam:  Vitals: Temperature is 98 pulse 90 respiratory rate is 29 blood pressure is 120/70  Ventilator Settings currently off the ventilator on 6 L Oxymizer  General: Comfortable at this time Neck: supple Cardiovascular: no malignant arrhythmias Respiratory: No rhonchi no rales are noted at this time Skin: no rash seen on limited exam Musculoskeletal: No gross abnormality Psychiatric:unable to assess Neurologic:no involuntary movements         Lab Data:   Basic Metabolic Panel: Recent Labs  Lab 02/24/21 0645 02/27/21 0600  NA 138 135  K 3.7 4.1  CL 102 98  CO2 26 29  GLUCOSE 117* 113*  BUN 22 16  CREATININE 0.73 0.49  CALCIUM 9.0 9.1  MG 2.0 2.3    ABG: Recent Labs  Lab 02/22/21 1214  PHART 7.473*  PCO2ART 34.2  PO2ART 70.3*  HCO3 24.7  O2SAT 94.9    Liver Function Tests: No results for input(s): AST, ALT, ALKPHOS, BILITOT, PROT, ALBUMIN in the last 168 hours. No results for input(s): LIPASE, AMYLASE in the last 168 hours. No results for input(s): AMMONIA in the last 168 hours.  CBC: Recent Labs  Lab 02/24/21 0645 02/27/21 0600  WBC 6.6 6.0  HGB 12.8 12.0  HCT 38.6 36.7  MCV 94.1 95.6  PLT 156 166    Cardiac Enzymes: No results for input(s): CKTOTAL, CKMB, CKMBINDEX, TROPONINI in the last 168 hours.  BNP (last 3 results) Recent Labs    01/16/21 0917 01/23/21 0628 01/27/21 0245  BNP 69.3 40.3 21.4    ProBNP (last 3 results) No results for input(s): PROBNP in the last 8760  hours.  Radiological Exams: No results found.  Assessment/Plan Active Problems:   Multifocal pneumonia   Acute on chronic respiratory failure with hypoxia (HCC)   Acute respiratory distress syndrome (ARDS) due to COVID-19 virus (HCC)   COVID-19 virus infection   Acute on chronic respiratory failure with hypoxia we will continue with the weaning oxygen down as tolerated use the BiPAP at nighttime Multifocal pneumonia slowly improved we will continue to follow along ARDS treated slowly improving COVID-19 virus infection again very slow to improve with some improvement in oxygenation Oxygen dependent we are weaning the oxygen as tolerated   I have personally seen and evaluated the patient, evaluated laboratory and imaging results, formulated the assessment and plan and placed orders. The Patient requires high complexity decision making with multiple systems involvement.  Rounds were done with the Respiratory Therapy Director and Staff therapists and discussed with nursing staff also.  Yevonne Pax, MD [facility] Pulmonary Critical Care Medicine Sleep Medicine

## 2021-03-02 ENCOUNTER — Other Ambulatory Visit (HOSPITAL_COMMUNITY): Payer: Self-pay

## 2021-03-02 DIAGNOSIS — I509 Heart failure, unspecified: Secondary | ICD-10-CM

## 2021-03-02 LAB — BLOOD GAS, ARTERIAL
Acid-base deficit: 0.6 mmol/L (ref 0.0–2.0)
Bicarbonate: 23.1 mmol/L (ref 20.0–28.0)
FIO2: 60
O2 Saturation: 94.8 %
Patient temperature: 36.8
pCO2 arterial: 34.2 mmHg (ref 32.0–48.0)
pH, Arterial: 7.442 (ref 7.350–7.450)
pO2, Arterial: 69.6 mmHg — ABNORMAL LOW (ref 83.0–108.0)

## 2021-03-02 MED ORDER — IOHEXOL 350 MG/ML SOLN
75.0000 mL | Freq: Once | INTRAVENOUS | Status: AC | PRN
  Administered 2021-03-02: 75 mL via INTRAVENOUS

## 2021-03-02 NOTE — Progress Notes (Signed)
Pulmonary Critical Care Medicine [facility] [place]   PULMONARY CRITICAL CARE SERVICE  PROGRESS NOTE     JHANAE JASKOWIAK  AOZ:[id number]  DOB: 12-Oct-1950   DOA: 01/19/2021  Referring Physician: Luna Kitchens, MD  HPI: MARCELLENE SHIVLEY is a 71 y.o. female being followed for ventilator/airway/oxygen weaning Acute on Chronic Respiratory Failure.  Patient is comfortable without distress has been on oxygen therapy which had to be increased overnight 12 L because of some desaturations  Medications: Reviewed on Rounds  Physical Exam:  Vitals: Temperature 98.2 pulse 60 respiratory rate is 35 blood pressure is 112/68 saturations 94%  Ventilator Settings currently on 12 L Oxymizer  General: Comfortable at this time Neck: supple Cardiovascular: no malignant arrhythmias Respiratory: Scattered rhonchi very coarse breath sounds Skin: no rash seen on limited exam Musculoskeletal: No gross abnormality Psychiatric:unable to assess Neurologic:no involuntary movements         Lab Data:   Basic Metabolic Panel: Recent Labs  Lab 02/24/21 0645 02/27/21 0600  NA 138 135  K 3.7 4.1  CL 102 98  CO2 26 29  GLUCOSE 117* 113*  BUN 22 16  CREATININE 0.73 0.49  CALCIUM 9.0 9.1  MG 2.0 2.3    ABG: Recent Labs  Lab 03/02/21 0915  PHART 7.442  PCO2ART 34.2  PO2ART 69.6*  HCO3 23.1  O2SAT 94.8    Liver Function Tests: No results for input(s): AST, ALT, ALKPHOS, BILITOT, PROT, ALBUMIN in the last 168 hours. No results for input(s): LIPASE, AMYLASE in the last 168 hours. No results for input(s): AMMONIA in the last 168 hours.  CBC: Recent Labs  Lab 02/24/21 0645 02/27/21 0600  WBC 6.6 6.0  HGB 12.8 12.0  HCT 38.6 36.7  MCV 94.1 95.6  PLT 156 166    Cardiac Enzymes: No results for input(s): CKTOTAL, CKMB, CKMBINDEX, TROPONINI in the last 168 hours.  BNP (last 3 results) Recent Labs    01/16/21 0917 01/23/21 0628 01/27/21 0245  BNP 69.3 40.3  21.4    ProBNP (last 3 results) No results for input(s): PROBNP in the last 8760 hours.  Radiological Exams: DG Chest Port 1 View  Result Date: 03/02/2021 CLINICAL DATA:  Respiratory failure. EXAM: PORTABLE CHEST 1 VIEW COMPARISON:  02/18/2021 FINDINGS: Stable cardiomediastinal contours. Bilateral upper and lower lobe interstitial and airspace opacities are unchanged compared with the previous exam. No new findings. IMPRESSION: No change in aeration to the  lungs compared with previous exam. Electronically Signed   By: Signa Kell M.D.   On: 03/02/2021 10:14    Assessment/Plan Active Problems:   Multifocal pneumonia   Acute on chronic respiratory failure with hypoxia (HCC)   Acute respiratory distress syndrome (ARDS) due to COVID-19 virus (HCC)   COVID-19 virus infection   Acute on chronic respiratory failure hypoxia there has been some deterioration in her oxygenation status the chest x-ray that was done revealed upper lower lobe airspace opacity with therapy basically unchanged she may have some excess fluid may benefit from diuresis Multifocal pneumonia has been treated we will continue to monitor along closely. COVID-19 virus infection in recovery with residual deficits ARDS again very slow to improve with persistence of infiltrates Oxygen dependent   I have personally seen and evaluated the patient, evaluated laboratory and imaging results, formulated the assessment and plan and placed orders. The Patient requires high complexity decision making with multiple systems involvement.  Rounds were done with the Respiratory Therapy Director and Staff therapists and discussed with nursing  staff also.  Allyne Gee, MD [facility] Pulmonary Critical Care Medicine Sleep Medicine

## 2021-03-03 LAB — BASIC METABOLIC PANEL
Anion gap: 9 (ref 5–15)
BUN: 17 mg/dL (ref 8–23)
CO2: 28 mmol/L (ref 22–32)
Calcium: 9.4 mg/dL (ref 8.9–10.3)
Chloride: 100 mmol/L (ref 98–111)
Creatinine, Ser: 0.6 mg/dL (ref 0.44–1.00)
GFR, Estimated: >60 mL/min (ref >60)
Glucose, Bld: 108 mg/dL — ABNORMAL HIGH (ref 70–99)
Potassium: 3.9 mmol/L (ref 3.5–5.1)
Sodium: 137 mmol/L (ref 135–145)

## 2021-03-03 LAB — CBC
HCT: 40.6 % (ref 36.0–46.0)
Hemoglobin: 13.7 g/dL (ref 12.0–15.0)
MCH: 31.9 pg (ref 26.0–34.0)
MCHC: 33.7 g/dL (ref 30.0–36.0)
MCV: 94.4 fL (ref 80.0–100.0)
Platelets: 187 10*3/uL (ref 150–400)
RBC: 4.3 MIL/uL (ref 3.87–5.11)
RDW: 16.3 % — ABNORMAL HIGH (ref 11.5–15.5)
WBC: 12.6 10*3/uL — ABNORMAL HIGH (ref 4.0–10.5)
nRBC: 0.2 % (ref 0.0–0.2)

## 2021-03-03 LAB — BLOOD GAS, ARTERIAL
Acid-Base Excess: 4.5 mmol/L — ABNORMAL HIGH (ref 0.0–2.0)
Acid-Base Excess: 1.8 mmol/L (ref 0.0–2.0)
Bicarbonate: 28.3 mmol/L — ABNORMAL HIGH (ref 20.0–28.0)
Bicarbonate: 25.1 mmol/L (ref 20.0–28.0)
FIO2: 65
FIO2: 80
O2 Saturation: 86 %
O2 Saturation: 88 %
Patient temperature: 36.1
Patient temperature: 36.1
pCO2 arterial: 39 mmHg (ref 32.0–48.0)
pCO2 arterial: 32.7 mmHg (ref 32.0–48.0)
pH, Arterial: 7.469 — ABNORMAL HIGH (ref 7.350–7.450)
pH, Arterial: 7.492 — ABNORMAL HIGH (ref 7.350–7.450)
pO2, Arterial: 47.8 mmHg — ABNORMAL LOW (ref 83.0–108.0)
pO2, Arterial: 49.1 mmHg — ABNORMAL LOW (ref 83.0–108.0)

## 2021-03-03 NOTE — Progress Notes (Signed)
Pulmonary Critical Care Medicine [facility] [place]   PULMONARY CRITICAL CARE SERVICE  PROGRESS NOTE     Christina Higgins  GNF:[id number]  DOB: 04-Jul-1950   DOA: 01/10/2021  Referring Physician: Luna Kitchens, MD  HPI: Christina Higgins is a 71 y.o. female being followed for ventilator/airway/oxygen weaning Acute on Chronic Respiratory Failure.  Patient is on an Oxymizer with Ventimask this morning to use the BiPAP at nighttime  Medications: Reviewed on Rounds  Physical Exam:  Vitals: Temperature is 96.0 pulse 73 respiratory to is 29 blood pressure one 5/68 saturations 95%  Ventilator Settings on Oxymizer 6 to 10 L  General: Comfortable at this time Neck: supple Cardiovascular: no malignant arrhythmias Respiratory: No rhonchi very coarse breath sounds Skin: no rash seen on limited exam Musculoskeletal: No gross abnormality Psychiatric:unable to assess Neurologic:no involuntary movements         Lab Data:   Basic Metabolic Panel: Recent Labs  Lab 02/27/21 0600 03/03/21 0641  NA 135 137  K 4.1 3.9  CL 98 100  CO2 29 28  GLUCOSE 113* 108*  BUN 16 17  CREATININE 0.49 0.60  CALCIUM 9.1 9.4  MG 2.3  --     ABG: Recent Labs  Lab 03/02/21 0915 03/03/21 0540  PHART 7.442 7.469*  PCO2ART 34.2 39.0  PO2ART 69.6* 47.8*  HCO3 23.1 28.3*  O2SAT 94.8 86.0    Liver Function Tests: No results for input(s): AST, ALT, ALKPHOS, BILITOT, PROT, ALBUMIN in the last 168 hours. No results for input(s): LIPASE, AMYLASE in the last 168 hours. No results for input(s): AMMONIA in the last 168 hours.  CBC: Recent Labs  Lab 02/27/21 0600 03/03/21 0641  WBC 6.0 12.6*  HGB 12.0 13.7  HCT 36.7 40.6  MCV 95.6 94.4  PLT 166 187    Cardiac Enzymes: No results for input(s): CKTOTAL, CKMB, CKMBINDEX, TROPONINI in the last 168 hours.  BNP (last 3 results) Recent Labs    01/16/21 0917 01/23/21 0628 01/27/21 0245  BNP 69.3 40.3 21.4    ProBNP  (last 3 results) No results for input(s): PROBNP in the last 8760 hours.  Radiological Exams: CT Angio Chest Pulmonary Embolism (PE) W or WO Contrast  Result Date: 03/02/2021 CLINICAL DATA:  Acute respiratory failure. Rule out pulmonary embolism. EXAM: CT ANGIOGRAPHY CHEST WITH CONTRAST TECHNIQUE: Multidetector CT imaging of the chest was performed using the standard protocol during bolus administration of intravenous contrast. Multiplanar CT image reconstructions and MIPs were obtained to evaluate the vascular anatomy. CONTRAST:  26mL OMNIPAQUE IOHEXOL 350 MG/ML SOLN COMPARISON:  01/29/2021 FINDINGS: Cardiovascular: Negative for pulmonary embolism. Heart size is mildly prominent. There may be a small amount of pericardial fluid. Normal caliber of the thoracic aorta. Normal caliber of the upper abdominal aorta. Celiac trunk and SMA are patent. Bilateral renal arteries are patent. There is an accessory left renal artery. Mediastinum/Nodes: Probable 1.1 cm low-density nodule in the left thyroid lobe on sequence 5 image 19. No follow-up imaging is recommended. Reference: J Am Coll Radiol. 2015 Feb;12(2): 143-50. No significant lymph node enlargement in the mediastinum or hila. No axillary lymph node enlargement. Lungs/Pleura: Diffuse bilateral airspace disease throughout both lungs. There is mild disease sparing near the apices bilaterally. Again noted is bronchiectasis in bilateral lower lobes. Persistent bronchiectasis in the right middle lobe. No significant PLEURAL fluid. Upper Abdomen: No acute abnormality in the upper abdomen. Musculoskeletal: Multilevel degenerative endplate changes in thoracic spine. No acute bone abnormality. Review of the MIP images  confirms the above findings. IMPRESSION: 1. Negative for pulmonary embolism. 2. Diffuse bilateral airspace disease. Disease has slightly progressed since CT on 01/29/2021. Electronically Signed   By: Richarda Overlie M.D.   On: 03/02/2021 15:02   DG Chest Port 1  View  Result Date: 03/02/2021 CLINICAL DATA:  Respiratory failure. EXAM: PORTABLE CHEST 1 VIEW COMPARISON:  02/18/2021 FINDINGS: Stable cardiomediastinal contours. Bilateral upper and lower lobe interstitial and airspace opacities are unchanged compared with the previous exam. No new findings. IMPRESSION: No change in aeration to the  lungs compared with previous exam. Electronically Signed   By: Signa Kell M.D.   On: 03/02/2021 10:14    Assessment/Plan Active Problems:   Multifocal pneumonia   Acute on chronic respiratory failure with hypoxia (HCC)   Acute respiratory distress syndrome (ARDS) due to COVID-19 virus (HCC)   COVID-19 virus infection   Acute on chronic respiratory failure hypoxia we will continue to titrate the Oxymizer as tolerated continue pulmonary toilet Multifocal pneumonia supportive care we will continue to follow along closely ARDS at baseline COVID-19 virus infection supportive care Oxygen dependent titrate as tolerated   I have personally seen and evaluated the patient, evaluated laboratory and imaging results, formulated the assessment and plan and placed orders. The Patient requires high complexity decision making with multiple systems involvement.  Rounds were done with the Respiratory Therapy Director and Staff therapists and discussed with nursing staff also.  Yevonne Pax, MD [facility] Pulmonary Critical Care Medicine Sleep Medicine

## 2021-03-04 NOTE — Progress Notes (Signed)
Pulmonary Critical Care Medicine [facility] [place]   PULMONARY CRITICAL CARE SERVICE  PROGRESS NOTE     Christina Higgins  [id number]  DOB: Jun 03, 1950   DOA: 2021/02/11  Referring Physician: Luna Kitchens, MD  HPI: Christina Higgins is a 71 y.o. female being followed for ventilator/airway/oxygen weaning Acute on Chronic Respiratory Failure.  Patient is comfortable right now without distress at this time she is down to 6 L Oxymizer  Medications: Reviewed on Rounds  Physical Exam:  Vitals: Temperature is 97.2 pulse 80 respiratory 16 blood pressure is 100/73 saturations 99%  Ventilator Settings patient is on 6 L Oxymizer  General: Comfortable at this time Neck: supple Cardiovascular: no malignant arrhythmias Respiratory: Scattered rhonchi expansion is equal Skin: no rash seen on limited exam Musculoskeletal: No gross abnormality Psychiatric:unable to assess Neurologic:no involuntary movements         Lab Data:   Basic Metabolic Panel: Recent Labs  Lab 02/27/21 0600 03/03/21 0641  NA 135 137  K 4.1 3.9  CL 98 100  CO2 29 28  GLUCOSE 113* 108*  BUN 16 17  CREATININE 0.49 0.60  CALCIUM 9.1 9.4  MG 2.3  --     ABG: Recent Labs  Lab 03/02/21 0915 03/03/21 0540 03/03/21 1050  PHART 7.442 7.469* 7.492*  PCO2ART 34.2 39.0 32.7  PO2ART 69.6* 47.8* 49.1*  HCO3 23.1 28.3* 25.1  O2SAT 94.8 86.0 88.0    Liver Function Tests: No results for input(s): AST, ALT, ALKPHOS, BILITOT, PROT, ALBUMIN in the last 168 hours. No results for input(s): LIPASE, AMYLASE in the last 168 hours. No results for input(s): AMMONIA in the last 168 hours.  CBC: Recent Labs  Lab 02/27/21 0600 03/03/21 0641  WBC 6.0 12.6*  HGB 12.0 13.7  HCT 36.7 40.6  MCV 95.6 94.4  PLT 166 187    Cardiac Enzymes: No results for input(s): CKTOTAL, CKMB, CKMBINDEX, TROPONINI in the last 168 hours.  BNP (last 3 results) Recent Labs    01/16/21 0917 01/23/21 0628  01/27/21 0245  BNP 69.3 40.3 21.4    ProBNP (last 3 results) No results for input(s): PROBNP in the last 8760 hours.  Radiological Exams: CT Angio Chest Pulmonary Embolism (PE) W or WO Contrast  Result Date: 03/02/2021 CLINICAL DATA:  Acute respiratory failure. Rule out pulmonary embolism. EXAM: CT ANGIOGRAPHY CHEST WITH CONTRAST TECHNIQUE: Multidetector CT imaging of the chest was performed using the standard protocol during bolus administration of intravenous contrast. Multiplanar CT image reconstructions and MIPs were obtained to evaluate the vascular anatomy. CONTRAST:  9mL OMNIPAQUE IOHEXOL 350 MG/ML SOLN COMPARISON:  01/29/2021 FINDINGS: Cardiovascular: Negative for pulmonary embolism. Heart size is mildly prominent. There may be a small amount of pericardial fluid. Normal caliber of the thoracic aorta. Normal caliber of the upper abdominal aorta. Celiac trunk and SMA are patent. Bilateral renal arteries are patent. There is an accessory left renal artery. Mediastinum/Nodes: Probable 1.1 cm low-density nodule in the left thyroid lobe on sequence 5 image 19. No follow-up imaging is recommended. Reference: J Am Coll Radiol. 2015 Feb;12(2): 143-50. No significant lymph node enlargement in the mediastinum or hila. No axillary lymph node enlargement. Lungs/Pleura: Diffuse bilateral airspace disease throughout both lungs. There is mild disease sparing near the apices bilaterally. Again noted is bronchiectasis in bilateral lower lobes. Persistent bronchiectasis in the right middle lobe. No significant PLEURAL fluid. Upper Abdomen: No acute abnormality in the upper abdomen. Musculoskeletal: Multilevel degenerative endplate changes in thoracic spine. No acute bone  abnormality. Review of the MIP images confirms the above findings. IMPRESSION: 1. Negative for pulmonary embolism. 2. Diffuse bilateral airspace disease. Disease has slightly progressed since CT on 01/29/2021. Electronically Signed   By: Richarda Overlie  M.D.   On: 03/02/2021 15:02   DG Chest Port 1 View  Result Date: 03/02/2021 CLINICAL DATA:  Respiratory failure. EXAM: PORTABLE CHEST 1 VIEW COMPARISON:  02/18/2021 FINDINGS: Stable cardiomediastinal contours. Bilateral upper and lower lobe interstitial and airspace opacities are unchanged compared with the previous exam. No new findings. IMPRESSION: No change in aeration to the  lungs compared with previous exam. Electronically Signed   By: Signa Kell M.D.   On: 03/02/2021 10:14    Assessment/Plan Active Problems:   Multifocal pneumonia   Acute on chronic respiratory failure with hypoxia (HCC)   Acute respiratory distress syndrome (ARDS) due to COVID-19 virus (HCC)   COVID-19 virus infection   Acute on chronic respiratory failure with hypoxia oxygen is down to 6 L we will continue to follow along closely. Multifocal pneumonia treated we will continue with supportive care patient has slow improvement COVID-19 virus infection in recovery phase ARDS slow to improve we will continue to monitor Oxygen dependent   I have personally seen and evaluated the patient, evaluated laboratory and imaging results, formulated the assessment and plan and placed orders. The Patient requires high complexity decision making with multiple systems involvement.  Rounds were done with the Respiratory Therapy Director and Staff therapists and discussed with nursing staff also.  Yevonne Pax, MD [facility] Pulmonary Critical Care Medicine Sleep Medicine

## 2021-03-05 LAB — CBC
HCT: 37.9 % (ref 36.0–46.0)
Hemoglobin: 12.6 g/dL (ref 12.0–15.0)
MCH: 31.6 pg (ref 26.0–34.0)
MCHC: 33.2 g/dL (ref 30.0–36.0)
MCV: 95 fL (ref 80.0–100.0)
Platelets: 168 10*3/uL (ref 150–400)
RBC: 3.99 MIL/uL (ref 3.87–5.11)
RDW: 16.4 % — ABNORMAL HIGH (ref 11.5–15.5)
WBC: 12.7 10*3/uL — ABNORMAL HIGH (ref 4.0–10.5)
nRBC: 0.2 % (ref 0.0–0.2)

## 2021-03-05 LAB — BASIC METABOLIC PANEL
Anion gap: 10 (ref 5–15)
BUN: 19 mg/dL (ref 8–23)
CO2: 28 mmol/L (ref 22–32)
Calcium: 9.4 mg/dL (ref 8.9–10.3)
Chloride: 100 mmol/L (ref 98–111)
Creatinine, Ser: 0.52 mg/dL (ref 0.44–1.00)
GFR, Estimated: >60 mL/min (ref >60)
Glucose, Bld: 184 mg/dL — ABNORMAL HIGH (ref 70–99)
Potassium: 4.5 mmol/L (ref 3.5–5.1)
Sodium: 138 mmol/L (ref 135–145)

## 2021-03-05 LAB — MAGNESIUM: Magnesium: 2.1 mg/dL (ref 1.7–2.4)

## 2021-03-05 NOTE — Progress Notes (Signed)
Pulmonary Critical Care Medicine [facility] [place]   PULMONARY CRITICAL CARE SERVICE  PROGRESS NOTE     Christina Higgins  VPX:[id number]  DOB: 1950-10-11   DOA: 01/08/2021  Referring Physician: Luna Kitchens, MD  HPI: Christina Higgins is a 71 y.o. female being followed for ventilator/airway/oxygen weaning Acute on Chronic Respiratory Failure.  Patient is on 8 L Oxymizer did not do the BiPAP last night for too long because of nasal irritation  Medications: Reviewed on Rounds  Physical Exam:  Vitals: Temperature is 97.8 pulse 70 respiratory 17 blood pressure is 111/66 saturations 98%  Ventilator Settings on 8 L Oxymizer  General: Comfortable at this time Neck: supple Cardiovascular: no malignant arrhythmias Respiratory: No rhonchi very coarse breath sounds Skin: no rash seen on limited exam Musculoskeletal: No gross abnormality Psychiatric:unable to assess Neurologic:no involuntary movements         Lab Data:   Basic Metabolic Panel: Recent Labs  Lab 02/27/21 0600 03/03/21 0641 03/05/21 0445  NA 135 137 138  K 4.1 3.9 4.5  CL 98 100 100  CO2 29 28 28   GLUCOSE 113* 108* 184*  BUN 16 17 19   CREATININE 0.49 0.60 0.52  CALCIUM 9.1 9.4 9.4  MG 2.3  --  2.1    ABG: Recent Labs  Lab 03/02/21 0915 03/03/21 0540 03/03/21 1050  PHART 7.442 7.469* 7.492*  PCO2ART 34.2 39.0 32.7  PO2ART 69.6* 47.8* 49.1*  HCO3 23.1 28.3* 25.1  O2SAT 94.8 86.0 88.0    Liver Function Tests: No results for input(s): AST, ALT, ALKPHOS, BILITOT, PROT, ALBUMIN in the last 168 hours. No results for input(s): LIPASE, AMYLASE in the last 168 hours. No results for input(s): AMMONIA in the last 168 hours.  CBC: Recent Labs  Lab 02/27/21 0600 03/03/21 0641 03/05/21 0445  WBC 6.0 12.6* 12.7*  HGB 12.0 13.7 12.6  HCT 36.7 40.6 37.9  MCV 95.6 94.4 95.0  PLT 166 187 168    Cardiac Enzymes: No results for input(s): CKTOTAL, CKMB, CKMBINDEX, TROPONINI in the  last 168 hours.  BNP (last 3 results) Recent Labs    01/16/21 0917 01/23/21 0628 01/27/21 0245  BNP 69.3 40.3 21.4    ProBNP (last 3 results) No results for input(s): PROBNP in the last 8760 hours.  Radiological Exams: No results found.  Assessment/Plan Active Problems:   Multifocal pneumonia   Acute on chronic respiratory failure with hypoxia (HCC)   Acute respiratory distress syndrome (ARDS) due to COVID-19 virus (HCC)   COVID-19 virus infection   Acute on chronic respiratory failure with hypoxia plan is to continue with the oxygen therapy titrate as tolerated encourage compliance with BiPAP at nighttime Multifocal pneumonia has been treated we will continue to follow along closely. COVID-19 virus infection in recovery phase we will continue with supportive care ARDS slow to improve Oxygen dependent we will continue to titrate   I have personally seen and evaluated the patient, evaluated laboratory and imaging results, formulated the assessment and plan and placed orders. The Patient requires high complexity decision making with multiple systems involvement.  Rounds were done with the Respiratory Therapy Director and Staff therapists and discussed with nursing staff also.  01/25/21, MD [facility] Pulmonary Critical Care Medicine Sleep Medicine

## 2021-03-06 NOTE — Progress Notes (Signed)
Pulmonary Critical Care Medicine [facility] [place]   PULMONARY CRITICAL CARE SERVICE  PROGRESS NOTE     Christina Higgins  [id number]  DOB: [birth year]   DOA: 02-24-21  Referring Physician: Luna Kitchens, MD  HPI: Christina Higgins is a 71 y.o. female being followed for ventilator/airway/oxygen weaning Acute on Chronic Respiratory Failure.  Patient is comfortable without any distress at this time remains on 10 L Oxymizer  Medications: Reviewed on Rounds  Physical Exam:  Vitals: Temperature is 96.3 pulse 77 respiratory rate is 28 blood pressure is 109/66 saturations 98%  Ventilator Settings on 10 L Oxymizer  General: Comfortable at this time Neck: supple Cardiovascular: no malignant arrhythmias Respiratory: No rhonchi very coarse breath sounds Skin: no rash seen on limited exam Musculoskeletal: No gross abnormality Psychiatric:unable to assess Neurologic:no involuntary movements         Lab Data:   Basic Metabolic Panel: Recent Labs  Lab 03/03/21 0641 03/05/21 0445  NA 137 138  K 3.9 4.5  CL 100 100  CO2 28 28  GLUCOSE 108* 184*  BUN 17 19  CREATININE 0.60 0.52  CALCIUM 9.4 9.4  MG  --  2.1    ABG: Recent Labs  Lab 03/02/21 0915 03/03/21 0540 03/03/21 1050  PHART 7.442 7.469* 7.492*  PCO2ART 34.2 39.0 32.7  PO2ART 69.6* 47.8* 49.1*  HCO3 23.1 28.3* 25.1  O2SAT 94.8 86.0 88.0    Liver Function Tests: No results for input(s): AST, ALT, ALKPHOS, BILITOT, PROT, ALBUMIN in the last 168 hours. No results for input(s): LIPASE, AMYLASE in the last 168 hours. No results for input(s): AMMONIA in the last 168 hours.  CBC: Recent Labs  Lab 03/03/21 0641 03/05/21 0445  WBC 12.6* 12.7*  HGB 13.7 12.6  HCT 40.6 37.9  MCV 94.4 95.0  PLT 187 168    Cardiac Enzymes: No results for input(s): CKTOTAL, CKMB, CKMBINDEX, TROPONINI in the last 168 hours.  BNP (last 3 results) Recent Labs    01/16/21 0917 01/23/21 0628  01/27/21 0245  BNP 69.3 40.3 21.4    ProBNP (last 3 results) No results for input(s): PROBNP in the last 8760 hours.  Radiological Exams: No results found.  Assessment/Plan Active Problems:   Multifocal pneumonia   Acute on chronic respiratory failure with hypoxia (HCC)   Acute respiratory distress syndrome (ARDS) due to COVID-19 virus (HCC)   COVID-19 virus infection   Acute on chronic respiratory failure hypoxia we will continue encouraging BiPAP at nighttime and oxygen therapy during the daytime. Multifocal pneumonia has been treated we will continue to follow along closely. ARDS supportive care COVID-19 virus infection at baseline Oxygen dependent slow to improve   I have personally seen and evaluated the patient, evaluated laboratory and imaging results, formulated the assessment and plan and placed orders. The Patient requires high complexity decision making with multiple systems involvement.  Rounds were done with the Respiratory Therapy Director and Staff therapists and discussed with nursing staff also.  Yevonne Pax, MD [facility] Pulmonary Critical Care Medicine Sleep Medicine

## 2021-03-07 ENCOUNTER — Other Ambulatory Visit (HOSPITAL_COMMUNITY): Payer: Self-pay

## 2021-03-07 LAB — URINALYSIS, ROUTINE W REFLEX MICROSCOPIC
Bilirubin Urine: NEGATIVE
Glucose, UA: NEGATIVE mg/dL
Ketones, ur: NEGATIVE mg/dL
Leukocytes,Ua: NEGATIVE
Nitrite: NEGATIVE
Protein, ur: NEGATIVE mg/dL
Specific Gravity, Urine: 1.02 (ref 1.005–1.030)
pH: 6.5 (ref 5.0–8.0)

## 2021-03-07 LAB — BASIC METABOLIC PANEL
Anion gap: 9 (ref 5–15)
BUN: 17 mg/dL (ref 8–23)
CO2: 28 mmol/L (ref 22–32)
Calcium: 9.5 mg/dL (ref 8.9–10.3)
Chloride: 98 mmol/L (ref 98–111)
Creatinine, Ser: 0.46 mg/dL (ref 0.44–1.00)
GFR, Estimated: >60 mL/min (ref >60)
Glucose, Bld: 191 mg/dL — ABNORMAL HIGH (ref 70–99)
Potassium: 4.6 mmol/L (ref 3.5–5.1)
Sodium: 135 mmol/L (ref 135–145)

## 2021-03-07 LAB — CBC
HCT: 39.8 % (ref 36.0–46.0)
Hemoglobin: 12.9 g/dL (ref 12.0–15.0)
MCH: 30.8 pg (ref 26.0–34.0)
MCHC: 32.4 g/dL (ref 30.0–36.0)
MCV: 95 fL (ref 80.0–100.0)
Platelets: 163 10*3/uL (ref 150–400)
RBC: 4.19 MIL/uL (ref 3.87–5.11)
RDW: 16.4 % — ABNORMAL HIGH (ref 11.5–15.5)
WBC: 13.5 10*3/uL — ABNORMAL HIGH (ref 4.0–10.5)
nRBC: 0 % (ref 0.0–0.2)

## 2021-03-07 LAB — GRAM STAIN

## 2021-03-07 LAB — URINALYSIS, MICROSCOPIC (REFLEX)

## 2021-03-07 LAB — MAGNESIUM: Magnesium: 2.1 mg/dL (ref 1.7–2.4)

## 2021-03-07 NOTE — Progress Notes (Signed)
Pulmonary Critical Care Medicine [place]   PULMONARY CRITICAL CARE SERVICE  PROGRESS NOTE     Christina Higgins  [id number]  DOB: February 23, 1951   DOA: 01/18/2021  Referring Physician: Satira Sark, MD  HPI: Christina Higgins is a 71 y.o. female being followed for ventilator/airway/oxygen weaning Acute on Chronic Respiratory Failure.  Patient is afebrile without distress has been resting comfortably on 2 L of oxygen she is still having some episodes of desaturation with movement as she did this morning down into the 60s  Medications: Reviewed on Rounds  Physical Exam:  Vitals: Temperature is 97.5 pulse 76 respiratory is 36 blood pressure is 106/75 saturations 98%  Ventilator Settings on BiPAP again this morning after her desaturation event  General: Comfortable at this time Neck: supple Cardiovascular: no malignant arrhythmias Respiratory: Scattered coarse rhonchi Skin: no rash seen on limited exam Musculoskeletal: No gross abnormality Psychiatric:unable to assess Neurologic:no involuntary movements         Lab Data:   Basic Metabolic Panel: Recent Labs  Lab 03/03/21 0641 03/05/21 0445 03/07/21 0352  NA 137 138 135  K 3.9 4.5 4.6  CL 100 100 98  CO2 28 28 28   GLUCOSE 108* 184* 191*  BUN 17 19 17   CREATININE 0.60 0.52 0.46  CALCIUM 9.4 9.4 9.5  MG  --  2.1 2.1    ABG: Recent Labs  Lab 03/02/21 0915 03/03/21 0540 03/03/21 1050  PHART 7.442 7.469* 7.492*  PCO2ART 34.2 39.0 32.7  PO2ART 69.6* 47.8* 49.1*  HCO3 23.1 28.3* 25.1  O2SAT 94.8 86.0 88.0    Liver Function Tests: No results for input(s): AST, ALT, ALKPHOS, BILITOT, PROT, ALBUMIN in the last 168 hours. No results for input(s): LIPASE, AMYLASE in the last 168 hours. No results for input(s): AMMONIA in the last 168 hours.  CBC: Recent Labs  Lab 03/03/21 0641 03/05/21 0445 03/07/21 0352  WBC 12.6* 12.7* 13.5*  HGB 13.7 12.6 12.9  HCT 40.6 37.9 39.8  MCV  94.4 95.0 95.0  PLT 187 168 163    Cardiac Enzymes: No results for input(s): CKTOTAL, CKMB, CKMBINDEX, TROPONINI in the last 168 hours.  BNP (last 3 results) Recent Labs    01/16/21 0917 01/23/21 0628 01/27/21 0245  BNP 69.3 40.3 21.4    ProBNP (last 3 results) No results for input(s): PROBNP in the last 8760 hours.  Radiological Exams: No results found.  Assessment/Plan Active Problems:   Multifocal pneumonia   Acute on chronic respiratory failure with hypoxia (HCC)   Acute respiratory distress syndrome (ARDS) due to COVID-19 virus (HCC)   COVID-19 virus infection   Acute on chronic respiratory failure hypoxia we will continue with BiPAP support once she recovers we will place patient back on 10 L Oxymizer Multifocal pneumonia treated slow improvement COVID-19 virus infection very very slow to recover ARDS again as above severe pulmonary damage Chronic oxygen dependent   I have personally seen and evaluated the patient, evaluated laboratory and imaging results, formulated the assessment and plan and placed orders. The Patient requires high complexity decision making with multiple systems involvement.  Rounds were done with the Respiratory Therapy Director and Staff therapists and discussed with nursing staff also.  Christina Gee, MD [facility] Pulmonary Critical Care Medicine Sleep Medicine

## 2021-03-08 LAB — URINE CULTURE: Culture: 70000 — AB

## 2021-03-08 LAB — RESP PANEL BY RT-PCR (FLU A&B, COVID) ARPGX2
Influenza A by PCR: NEGATIVE
Influenza B by PCR: NEGATIVE
SARS Coronavirus 2 by RT PCR: NEGATIVE

## 2021-03-08 NOTE — Progress Notes (Signed)
Pulmonary Critical Care Medicine [facility] [place]   PULMONARY CRITICAL CARE SERVICE  PROGRESS NOTE     Christina Higgins  WEX:[id number]  DOB: Nov 29, 1950   DOA: 2021-02-18  Referring Physician: Luna Kitchens, MD  HPI: Christina Higgins is a 71 y.o. female being followed for ventilator/airway/oxygen weaning Acute on Chronic Respiratory Failure.  Patient is on 12 L Oxymizer still having issues with desaturations.  Reviewed the CT scan and chest x-rays.  Patient continues to be oxygen dependent doing poorly.  Husband appears to understand that she is not doing well overall  Medications: Reviewed on Rounds  Physical Exam:  Vitals: Temperature is 97.0 pulse 82 respiratory is 28 blood pressure is 118/73 saturations 99%  Ventilator Settings on 10 L Oxymizer  General: Comfortable at this time Neck: supple Cardiovascular: no malignant arrhythmias Respiratory: Scattered rhonchi very coarse breath Skin: no rash seen on limited exam Musculoskeletal: No gross abnormality Psychiatric:unable to assess Neurologic:no involuntary movements         Lab Data:   Basic Metabolic Panel: Recent Labs  Lab 03/03/21 0641 03/05/21 0445 03/07/21 0352  NA 137 138 135  K 3.9 4.5 4.6  CL 100 100 98  CO2 28 28 28   GLUCOSE 108* 184* 191*  BUN 17 19 17   CREATININE 0.60 0.52 0.46  CALCIUM 9.4 9.4 9.5  MG  --  2.1 2.1    ABG: Recent Labs  Lab 03/02/21 0915 03/03/21 0540 03/03/21 1050  PHART 7.442 7.469* 7.492*  PCO2ART 34.2 39.0 32.7  PO2ART 69.6* 47.8* 49.1*  HCO3 23.1 28.3* 25.1  O2SAT 94.8 86.0 88.0    Liver Function Tests: No results for input(s): AST, ALT, ALKPHOS, BILITOT, PROT, ALBUMIN in the last 168 hours. No results for input(s): LIPASE, AMYLASE in the last 168 hours. No results for input(s): AMMONIA in the last 168 hours.  CBC: Recent Labs  Lab 03/03/21 0641 03/05/21 0445 03/07/21 0352  WBC 12.6* 12.7* 13.5*  HGB 13.7 12.6 12.9  HCT 40.6 37.9  39.8  MCV 94.4 95.0 95.0  PLT 187 168 163    Cardiac Enzymes: No results for input(s): CKTOTAL, CKMB, CKMBINDEX, TROPONINI in the last 168 hours.  BNP (last 3 results) Recent Labs    01/16/21 0917 01/23/21 0628 01/27/21 0245  BNP 69.3 40.3 21.4    ProBNP (last 3 results) No results for input(s): PROBNP in the last 8760 hours.  Radiological Exams: DG Chest Port 1 View  Result Date: 03/07/2021 CLINICAL DATA:  Pneumonia. EXAM: PORTABLE CHEST 1 VIEW COMPARISON:  03/02/2021. FINDINGS: Patchy bilateral airspace disease is again noted, similar to prior. Low volume film with upper normal cardiopericardial silhouette. Bones are diffusely demineralized. Telemetry leads overlie the chest. IMPRESSION: No substantial interval change in patchy bilateral airspace disease. Electronically Signed   By: 05/05/2021 M.D.   On: 03/07/2021 10:50    Assessment/Plan Active Problems:   Multifocal pneumonia   Acute on chronic respiratory failure with hypoxia (HCC)   Acute respiratory distress syndrome (ARDS) due to COVID-19 virus (HCC)   COVID-19 virus infection   Acute on chronic respiratory failure hypoxia use the BiPAP at nighttime Oxymizer during the daytime prognosis is quite guarded Multifocal pneumonia has been treated we will continue to follow supportive care patient's x-rays were reviewed and its not really shown any major changes still with persistent infiltrates.  CT scan shows groundglass opacities ARDS picture COVID-19 virus infection patient is at baseline but still has not had fully recovered ARDS as above  multifocal diffuse groundglass opacities noted in the lung field Oxygen dependent   I have personally seen and evaluated the patient, evaluated laboratory and imaging results, formulated the assessment and plan and placed orders. The Patient requires high complexity decision making with multiple systems involvement.  Rounds were done with the Respiratory Therapy Director and Staff  therapists and discussed with nursing staff also.  Yevonne Pax, MD [facility] Pulmonary Critical Care Medicine Sleep Medicine

## 2021-03-09 LAB — BASIC METABOLIC PANEL
Anion gap: 9 (ref 5–15)
BUN: 19 mg/dL (ref 8–23)
CO2: 27 mmol/L (ref 22–32)
Calcium: 9.3 mg/dL (ref 8.9–10.3)
Chloride: 100 mmol/L (ref 98–111)
Creatinine, Ser: 0.52 mg/dL (ref 0.44–1.00)
GFR, Estimated: >60 mL/min (ref >60)
Glucose, Bld: 208 mg/dL — ABNORMAL HIGH (ref 70–99)
Potassium: 4.5 mmol/L (ref 3.5–5.1)
Sodium: 136 mmol/L (ref 135–145)

## 2021-03-09 LAB — CBC
HCT: 39.2 % (ref 36.0–46.0)
Hemoglobin: 13.1 g/dL (ref 12.0–15.0)
MCH: 31.8 pg (ref 26.0–34.0)
MCHC: 33.4 g/dL (ref 30.0–36.0)
MCV: 95.1 fL (ref 80.0–100.0)
Platelets: 164 10*3/uL (ref 150–400)
RBC: 4.12 MIL/uL (ref 3.87–5.11)
RDW: 16.4 % — ABNORMAL HIGH (ref 11.5–15.5)
WBC: 11.6 10*3/uL — ABNORMAL HIGH (ref 4.0–10.5)
nRBC: 0.3 % — ABNORMAL HIGH (ref 0.0–0.2)

## 2021-03-09 LAB — MAGNESIUM: Magnesium: 2.2 mg/dL (ref 1.7–2.4)

## 2021-03-09 NOTE — Progress Notes (Signed)
Pulmonary Critical Care Medicine [facility] [place]   PULMONARY CRITICAL CARE SERVICE  PROGRESS NOTE     Christina Higgins  [id number]  DOB: [birth year]   DOA: 01/24/2021  Referring Physician: Luna Kitchens, MD  HPI: Christina Higgins is a 71 y.o. female being followed for ventilator/airway/oxygen weaning Acute on Chronic Respiratory Failure.  Patient currently is on 10 L Oxymizer saturations are still waxing and waning  Medications: Reviewed on Rounds  Physical Exam:  Vitals: Temperature is 96.8 pulse 80 respiratory 24 blood pressure is 117/71 saturations 99%  Ventilator Settings on Oxymizer 10 L  General: Comfortable at this time Neck: supple Cardiovascular: no malignant arrhythmias Respiratory: No rhonchi very coarse breath sounds Skin: no rash seen on limited exam Musculoskeletal: No gross abnormality Psychiatric:unable to assess Neurologic:no involuntary movements         Lab Data:   Basic Metabolic Panel: Recent Labs  Lab 03/03/21 0641 03/05/21 0445 03/07/21 0352 03/09/21 0452  NA 137 138 135 136  K 3.9 4.5 4.6 4.5  CL 100 100 98 100  CO2 28 28 28 27   GLUCOSE 108* 184* 191* 208*  BUN 17 19 17 19   CREATININE 0.60 0.52 0.46 0.52  CALCIUM 9.4 9.4 9.5 9.3  MG  --  2.1 2.1 2.2    ABG: Recent Labs  Lab 03/03/21 0540 03/03/21 1050  PHART 7.469* 7.492*  PCO2ART 39.0 32.7  PO2ART 47.8* 49.1*  HCO3 28.3* 25.1  O2SAT 86.0 88.0    Liver Function Tests: No results for input(s): AST, ALT, ALKPHOS, BILITOT, PROT, ALBUMIN in the last 168 hours. No results for input(s): LIPASE, AMYLASE in the last 168 hours. No results for input(s): AMMONIA in the last 168 hours.  CBC: Recent Labs  Lab 03/03/21 0641 03/05/21 0445 03/07/21 0352 03/09/21 0452  WBC 12.6* 12.7* 13.5* 11.6*  HGB 13.7 12.6 12.9 13.1  HCT 40.6 37.9 39.8 39.2  MCV 94.4 95.0 95.0 95.1  PLT 187 168 163 164    Cardiac Enzymes: No results for input(s): CKTOTAL,  CKMB, CKMBINDEX, TROPONINI in the last 168 hours.  BNP (last 3 results) Recent Labs    01/16/21 0917 01/23/21 0628 01/27/21 0245  BNP 69.3 40.3 21.4    ProBNP (last 3 results) No results for input(s): PROBNP in the last 8760 hours.  Radiological Exams: DG Chest Port 1 View  Result Date: 03/07/2021 CLINICAL DATA:  Pneumonia. EXAM: PORTABLE CHEST 1 VIEW COMPARISON:  03/02/2021. FINDINGS: Patchy bilateral airspace disease is again noted, similar to prior. Low volume film with upper normal cardiopericardial silhouette. Bones are diffusely demineralized. Telemetry leads overlie the chest. IMPRESSION: No substantial interval change in patchy bilateral airspace disease. Electronically Signed   By: 05/05/2021 M.D.   On: 03/07/2021 10:50    Assessment/Plan Active Problems:   Multifocal pneumonia   Acute on chronic respiratory failure with hypoxia (HCC)   Acute respiratory distress syndrome (ARDS) due to COVID-19 virus (HCC)   COVID-19 virus infection   Acute on chronic respiratory failure hypoxia we will continue with Oxymizer titrate oxygen as tolerated continue secretion management pulmonary toilet Multifocal pneumonia has been treated with antibiotics ARDS very slow to improve patient has severe residual groundglass opacities on the last x-ray and CT COVID-19 virus infection Oxygen dependent titrating as necessary   I have personally seen and evaluated the patient, evaluated laboratory and imaging results, formulated the assessment and plan and placed orders. The Patient requires high complexity decision making with multiple systems involvement.  Rounds  were done with the Respiratory Therapy Director and Staff therapists and discussed with nursing staff also.  Allyne Gee, MD [facility] Pulmonary Critical Care Medicine Sleep Medicine

## 2021-03-10 NOTE — Progress Notes (Signed)
Pulmonary Critical Care Medicine [facility] [place]   PULMONARY CRITICAL CARE SERVICE  PROGRESS NOTE     Christina Higgins  Christina Higgins  DOB: 1950-07-12   DOA: 01/29/2021  Referring Physician: Luna Kitchens, MD  HPI: Christina Higgins is a 71 y.o. female being followed for ventilator/airway/oxygen weaning Acute on Chronic Respiratory Failure.  Patient is on 10 L Oxymizer use of BiPAP overnight seems to be comfortable this morning  Medications: Reviewed on Rounds  Physical Exam:  Vitals: Temperature is 96.1 pulse 88 respiratory rate is 23 blood pressure is 120/66 saturations 93%  Ventilator Settings on 10 L Oxymizer   General: Comfortable at this time Neck: supple Cardiovascular: no malignant arrhythmias Respiratory: Scattered rhonchi expansion equal Skin: no rash seen on limited exam Musculoskeletal: No gross abnormality Psychiatric:unable to assess Neurologic:no involuntary movements         Lab Data:   Basic Metabolic Panel: Recent Labs  Lab 03/05/21 0445 03/07/21 0352 03/09/21 0452  NA 138 135 136  K 4.5 4.6 4.5  CL 100 98 100  CO2 28 28 27   GLUCOSE 184* 191* 208*  BUN 19 17 19   CREATININE 0.52 0.46 0.52  CALCIUM 9.4 9.5 9.3  MG 2.1 2.1 2.2    ABG: Recent Labs  Lab 03/03/21 1050  PHART 7.492*  PCO2ART 32.7  PO2ART 49.1*  HCO3 25.1  O2SAT 88.0    Liver Function Tests: No results for input(s): AST, ALT, ALKPHOS, BILITOT, PROT, ALBUMIN in the last 168 hours. No results for input(s): LIPASE, AMYLASE in the last 168 hours. No results for input(s): AMMONIA in the last 168 hours.  CBC: Recent Labs  Lab 03/05/21 0445 03/07/21 0352 03/09/21 0452  WBC 12.7* 13.5* 11.6*  HGB 12.6 12.9 13.1  HCT 37.9 39.8 39.2  MCV 95.0 95.0 95.1  PLT 168 163 164    Cardiac Enzymes: No results for input(s): CKTOTAL, CKMB, CKMBINDEX, TROPONINI in the last 168 hours.  BNP (last 3 results) Recent Labs    01/16/21 0917 01/23/21 0628  01/27/21 0245  BNP 69.3 40.3 21.4    ProBNP (last 3 results) No results for input(s): PROBNP in the last 8760 hours.  Radiological Exams: No results found.  Assessment/Plan Active Problems:   Multifocal pneumonia   Acute on chronic respiratory failure with hypoxia (HCC)   Acute respiratory distress syndrome (ARDS) due to COVID-19 virus (HCC)   COVID-19 virus infection   Acute on chronic respiratory failure with hypoxia we will continue with oxygen therapy prognosis remains guarded further complete weaning may need to consider placement in skilled nursing facility however her status is tenuous Multifocal pneumonia very very slow to improve supportive care ARDS patient still has diffuse groundglass changes noted on the last scan COVID-19 virus infection itself is in recovery very residual pulmonary damage Oxygen dependent   I have personally seen and evaluated the patient, evaluated laboratory and imaging results, formulated the assessment and plan and placed orders. The Patient requires high complexity decision making with multiple systems involvement.  Rounds were done with the Respiratory Therapy Director and Staff therapists and discussed with nursing staff also.  01/25/21, MD [facility] Pulmonary Critical Care Medicine Sleep Medicine

## 2021-03-11 LAB — URINALYSIS, MICROSCOPIC (REFLEX)
RBC / HPF: >50 RBC/hpf (ref 0–5)
WBC, UA: >50 WBC/hpf (ref 0–5)

## 2021-03-11 LAB — URINALYSIS, ROUTINE W REFLEX MICROSCOPIC

## 2021-03-11 LAB — CBC
HCT: 41.7 % (ref 36.0–46.0)
Hemoglobin: 13.8 g/dL (ref 12.0–15.0)
MCH: 30.9 pg (ref 26.0–34.0)
MCHC: 33.1 g/dL (ref 30.0–36.0)
MCV: 93.3 fL (ref 80.0–100.0)
Platelets: 178 10*3/uL (ref 150–400)
RBC: 4.47 MIL/uL (ref 3.87–5.11)
RDW: 16.5 % — ABNORMAL HIGH (ref 11.5–15.5)
WBC: 14.4 10*3/uL — ABNORMAL HIGH (ref 4.0–10.5)
nRBC: 0.1 % (ref 0.0–0.2)

## 2021-03-11 NOTE — Progress Notes (Signed)
Pulmonary Critical Care Medicine [facility] [place]   PULMONARY CRITICAL CARE SERVICE  PROGRESS NOTE     Christina Higgins  [id number]  DOB: Dec 31, 1950   DOA: 01/08/2021  Referring Physician: Luna Kitchens, MD  HPI: Christina Higgins is a 71 y.o. female being followed for ventilator/airway/oxygen weaning Acute on Chronic Respiratory Failure.  Patient is on 10 L Oxymizer was using the BiPAP at nighttime did okay overnight  Medications: Reviewed on Rounds  Physical Exam:  Vitals: Temperature is 96.2 pulse 94 respiratory to is 30 blood pressure is 128/72 saturations 98%  Ventilator Settings on 10 L Oxymizer  General: Comfortable at this time Neck: supple Cardiovascular: no malignant arrhythmias Respiratory: No rhonchi very coarse breath sounds Skin: no rash seen on limited exam Musculoskeletal: No gross abnormality Psychiatric:unable to assess Neurologic:no involuntary movements         Lab Data:   Basic Metabolic Panel: Recent Labs  Lab 03/05/21 0445 03/07/21 0352 03/09/21 0452  NA 138 135 136  K 4.5 4.6 4.5  CL 100 98 100  CO2 28 28 27   GLUCOSE 184* 191* 208*  BUN 19 17 19   CREATININE 0.52 0.46 0.52  CALCIUM 9.4 9.5 9.3  MG 2.1 2.1 2.2    ABG: No results for input(s): PHART, PCO2ART, PO2ART, HCO3, O2SAT in the last 168 hours.  Liver Function Tests: No results for input(s): AST, ALT, ALKPHOS, BILITOT, PROT, ALBUMIN in the last 168 hours. No results for input(s): LIPASE, AMYLASE in the last 168 hours. No results for input(s): AMMONIA in the last 168 hours.  CBC: Recent Labs  Lab 03/05/21 0445 03/07/21 0352 03/09/21 0452 03/11/21 0407  WBC 12.7* 13.5* 11.6* 14.4*  HGB 12.6 12.9 13.1 13.8  HCT 37.9 39.8 39.2 41.7  MCV 95.0 95.0 95.1 93.3  PLT 168 163 164 178    Cardiac Enzymes: No results for input(s): CKTOTAL, CKMB, CKMBINDEX, TROPONINI in the last 168 hours.  BNP (last 3 results) Recent Labs    01/16/21 0917  01/23/21 0628 01/27/21 0245  BNP 69.3 40.3 21.4    ProBNP (last 3 results) No results for input(s): PROBNP in the last 8760 hours.  Radiological Exams: No results found.  Assessment/Plan Active Problems:   Multifocal pneumonia   Acute on chronic respiratory failure with hypoxia (HCC)   Acute respiratory distress syndrome (ARDS) due to COVID-19 virus (HCC)   COVID-19 virus infection   Acute on chronic respiratory failure hypoxia we will continue with Oxymizer titrate oxygen as tolerated continue pulmonary toilet. Multifocal pneumonia very slow to improve we will continue to follow along patient has already been on steroids spoke with primary care team yesterday they are going to start her back on the Humira ARDS very slow to improve we will continue to follow along closely COVID-19 virus infection in recovery Oxygen dependent has not had major improvement in oxygen requirements unfortunately   I have personally seen and evaluated the patient, evaluated laboratory and imaging results, formulated the assessment and plan and placed orders. The Patient requires high complexity decision making with multiple systems involvement.  Rounds were done with the Respiratory Therapy Director and Staff therapists and discussed with nursing staff also.  01/25/21, MD [facility] Pulmonary Critical Care Medicine Sleep Medicine

## 2021-03-12 LAB — BLOOD GAS, ARTERIAL
Acid-base deficit: 1.8 mmol/L (ref 0.0–2.0)
Bicarbonate: 22.1 mmol/L (ref 20.0–28.0)
FIO2: 50
O2 Saturation: 89.6 %
Patient temperature: 36.5
pCO2 arterial: 34.2 mmHg (ref 32.0–48.0)
pH, Arterial: 7.423 (ref 7.350–7.450)
pO2, Arterial: 57.3 mmHg — ABNORMAL LOW (ref 83.0–108.0)

## 2021-03-12 LAB — CBC
HCT: 42.8 % (ref 36.0–46.0)
Hemoglobin: 14.2 g/dL (ref 12.0–15.0)
MCH: 31.2 pg (ref 26.0–34.0)
MCHC: 33.2 g/dL (ref 30.0–36.0)
MCV: 94.1 fL (ref 80.0–100.0)
Platelets: 188 10*3/uL (ref 150–400)
RBC: 4.55 MIL/uL (ref 3.87–5.11)
RDW: 16.7 % — ABNORMAL HIGH (ref 11.5–15.5)
WBC: 16.8 10*3/uL — ABNORMAL HIGH (ref 4.0–10.5)
nRBC: 0.2 % (ref 0.0–0.2)

## 2021-03-12 LAB — URINE CULTURE

## 2021-03-12 NOTE — Progress Notes (Signed)
Pulmonary Critical Care Medicine [facility] [place]   PULMONARY CRITICAL CARE SERVICE  PROGRESS NOTE     [name]  [id number]:[id number]  DOB: 1950/08/23   DOA: 2021/02/08  Referring Physician: Luna Kitchens, MD  HPI: Christina Higgins is a 71 y.o. female being followed for ventilator/airway/oxygen weaning Acute on Chronic Respiratory Failure.  Patient is currently on 10 L Oxymizer using the BiPAP at nighttime she is resting comfortably right now  Medications: Reviewed on Rounds  Physical Exam:  Vitals: Temperature is 96.7 pulse 92 respiratory rate is 40 blood pressure 120/74 saturations 97%  Ventilator Settings on BiPAP  General: Comfortable at this time Neck: supple Cardiovascular: no malignant arrhythmias Respiratory: No rhonchi very coarse breath sounds Skin: no rash seen on limited exam Musculoskeletal: No gross abnormality Psychiatric:unable to assess Neurologic:no involuntary movements         Lab Data:   Basic Metabolic Panel: Recent Labs  Lab 03/07/21 0352 03/09/21 0452  NA 135 136  K 4.6 4.5  CL 98 100  CO2 28 27  GLUCOSE 191* 208*  BUN 17 19  CREATININE 0.46 0.52  CALCIUM 9.5 9.3  MG 2.1 2.2    ABG: No results for input(s): PHART, PCO2ART, PO2ART, HCO3, O2SAT in the last 168 hours.  Liver Function Tests: No results for input(s): AST, ALT, ALKPHOS, BILITOT, PROT, ALBUMIN in the last 168 hours. No results for input(s): LIPASE, AMYLASE in the last 168 hours. No results for input(s): AMMONIA in the last 168 hours.  CBC: Recent Labs  Lab 03/07/21 0352 03/09/21 0452 03/11/21 0407 03/12/21 0423  WBC 13.5* 11.6* 14.4* 16.8*  HGB 12.9 13.1 13.8 14.2  HCT 39.8 39.2 41.7 42.8  MCV 95.0 95.1 93.3 94.1  PLT 163 164 178 188    Cardiac Enzymes: No results for input(s): CKTOTAL, CKMB, CKMBINDEX, TROPONINI in the last 168 hours.  BNP (last 3 results) Recent Labs    01/16/21 0917 01/23/21 0628 01/27/21 0245  BNP 69.3  40.3 21.4    ProBNP (last 3 results) No results for input(s): PROBNP in the last 8760 hours.  Radiological Exams: No results found.  Assessment/Plan Active Problems:   Multifocal pneumonia   Acute on chronic respiratory failure with hypoxia (HCC)   Acute respiratory distress syndrome (ARDS) due to COVID-19 virus (HCC)   COVID-19 virus infection   Acute on chronic respiratory failure with hypoxia patient continues to be on the BiPAP at nighttime continue to monitor closely. COVID-19 virus infection in recovery we will continue with supportive care Multifocal pneumonia treated we will continue to monitor along closely ARDS supportive care Oxygen dependent continue to monitor   I have personally seen and evaluated the patient, evaluated laboratory and imaging results, formulated the assessment and plan and placed orders. The Patient requires high complexity decision making with multiple systems involvement.  Rounds were done with the Respiratory Therapy Director and Staff therapists and discussed with nursing staff also.  Yevonne Pax, MD [facility] Pulmonary Critical Care Medicine Sleep Medicine

## 2021-03-13 ENCOUNTER — Other Ambulatory Visit (HOSPITAL_COMMUNITY): Payer: Self-pay

## 2021-03-13 ENCOUNTER — Other Ambulatory Visit (HOSPITAL_BASED_OUTPATIENT_CLINIC_OR_DEPARTMENT_OTHER): Payer: Medicare Other

## 2021-03-13 DIAGNOSIS — I350 Nonrheumatic aortic (valve) stenosis: Secondary | ICD-10-CM | POA: Diagnosis not present

## 2021-03-13 LAB — CULTURE, BLOOD (ROUTINE X 2)
Culture: NO GROWTH
Culture: NO GROWTH
Special Requests: ADEQUATE
Special Requests: ADEQUATE

## 2021-03-13 LAB — ECHOCARDIOGRAM COMPLETE
AV Mean grad: 20.5 mmHg
AV Peak grad: 43.8 mmHg
Ao pk vel: 3.31 m/s
Area-P 1/2: 5.42 cm2

## 2021-03-13 LAB — CBC
HCT: 37.3 % (ref 36.0–46.0)
Hemoglobin: 12.8 g/dL (ref 12.0–15.0)
MCH: 31.8 pg (ref 26.0–34.0)
MCHC: 34.3 g/dL (ref 30.0–36.0)
MCV: 92.6 fL (ref 80.0–100.0)
Platelets: 156 10*3/uL (ref 150–400)
RBC: 4.03 MIL/uL (ref 3.87–5.11)
RDW: 16.4 % — ABNORMAL HIGH (ref 11.5–15.5)
WBC: 17.8 10*3/uL — ABNORMAL HIGH (ref 4.0–10.5)
nRBC: 0.1 % (ref 0.0–0.2)

## 2021-03-13 NOTE — Consult Note (Signed)
Ref: Laurann Montana, MD   Subjective:  Aortic valve stenosis  Objective:  Vital Signs in the last 24 hours:  P: 104, R: 40, BP: 123/73, O2 sat 97 %,  on 15 L high flow oxygen.  Physical Exam: BP Readings from Last 1 Encounters:  February 16, 2021 97/63     Wt Readings from Last 1 Encounters:  01/18/21 65.3 kg    Weight change:  There is no height or weight on file to calculate BMI. HEENT: [place]/AT, Eyes-Blue, Conjunctiva-Pink, Sclera-Non-icteric Neck: No JVD, No bruit, Trachea midline. Lungs:  Clearing, Bilateral. Cardiac:  Regular rhythm, normal S1 and S2, no S3. II/VI systolic murmur. Abdomen:  Soft, non-tender. BS present. Extremities:  No edema present. No cyanosis. No clubbing. CNS: AxOx3, Cranial nerves grossly intact, moves all 4 extremities.  Skin: Warm and dry.   Intake/Output from previous day: No intake/output data recorded.    Lab Results: BMET    Component Value Date/Time   NA 136 03/09/2021 0452   NA 135 03/07/2021 0352   NA 138 03/05/2021 0445   K 4.5 03/09/2021 0452   K 4.6 03/07/2021 0352   K 4.5 03/05/2021 0445   CL 100 03/09/2021 0452   CL 98 03/07/2021 0352   CL 100 03/05/2021 0445   CO2 27 03/09/2021 0452   CO2 28 03/07/2021 0352   CO2 28 03/05/2021 0445   GLUCOSE 208 (H) 03/09/2021 0452   GLUCOSE 191 (H) 03/07/2021 0352   GLUCOSE 184 (H) 03/05/2021 0445   BUN 19 03/09/2021 0452   BUN 17 03/07/2021 0352   BUN 19 03/05/2021 0445   CREATININE 0.52 03/09/2021 0452   CREATININE 0.46 03/07/2021 0352   CREATININE 0.52 03/05/2021 0445   CALCIUM 9.3 03/09/2021 0452   CALCIUM 9.5 03/07/2021 0352   CALCIUM 9.4 03/05/2021 0445   GFRNONAA >60 03/09/2021 0452   GFRNONAA >60 03/07/2021 0352   GFRNONAA >60 03/05/2021 0445   CBC    Component Value Date/Time   WBC 16.8 (H) 03/12/2021 0423   RBC 4.55 03/12/2021 0423   HGB 14.2 03/12/2021 0423   HCT 42.8 03/12/2021 0423   PLT 188 03/12/2021 0423   MCV 94.1 03/12/2021 0423   MCH 31.2 03/12/2021 0423    MCHC 33.2 03/12/2021 0423   RDW 16.7 (H) 03/12/2021 0423   LYMPHSABS 1.8 01/29/2021 0432   MONOABS 0.6 01/29/2021 0432   EOSABS 0.3 01/29/2021 0432   BASOSABS 0.0 01/29/2021 0432   HEPATIC Function Panel Recent Labs    01/17/21 0341 02/16/2021 0759 01/29/21 0432  PROT 5.3* 5.7* 5.3*   HEMOGLOBIN A1C No components found for: HGA1C,  MPG CARDIAC ENZYMES No results found for: CKTOTAL, CKMB, CKMBINDEX, TROPONINI BNP No results for input(s): PROBNP in the last 8760 hours. TSH Recent Labs    02/03/21 0328  TSH 2.971   CHOLESTEROL No results for input(s): CHOL in the last 8760 hours.  Scheduled Meds: Continuous Infusions: PRN Meds:.  Assessment/Plan:  Acute on chronic respiratory failure with hypoxia Interstitial lung disease S/P COVID infection AS, moderate LLE DVT Moderate protein calorie malnutrition  Plan: Continue medical therapy for now.   LOS: 0 days   Time spent including chart review, lab review, examination, discussion with patient/Nurse/RT : 30 min   Orpah Cobb  MD  03/13/2021, 8:40 PM

## 2021-03-13 NOTE — Progress Notes (Incomplete)
{  Select Note:[id number]} 

## 2021-03-13 NOTE — Progress Notes (Signed)
Pulmonary Critical Care Medicine [facility] [place]   PULMONARY CRITICAL CARE SERVICE  PROGRESS NOTE     Christina Higgins  QXI:[id number]  DOB: 24-Jan-1951   DOA: 01/20/2021  Referring Physician: Luna Kitchens, MD  HPI: Christina Higgins is a 71 y.o. female being followed for ventilator/airway/oxygen weaning Acute on Chronic Respiratory Failure.  Patient is comfortable without distress at this time has been on 10 L Oxymizer  Medications: Reviewed on Rounds  Physical Exam:  Vitals: Temperature is 97.9 pulse 94 respiratory 38 blood pressure is 109/67 saturations 92%  Ventilator Settings on assist control FiO2 35% tidal volume is 400 PEEP 5  General: Comfortable at this time Neck: supple Cardiovascular: no malignant arrhythmias Respiratory: Scattered rhonchi expansion is equal Skin: no rash seen on limited exam Musculoskeletal: No gross abnormality Psychiatric:unable to assess Neurologic:no involuntary movements         Lab Data:   Basic Metabolic Panel: Recent Labs  Lab 03/07/21 0352 03/09/21 0452  NA 135 136  K 4.6 4.5  CL 98 100  CO2 28 27  GLUCOSE 191* 208*  BUN 17 19  CREATININE 0.46 0.52  CALCIUM 9.5 9.3  MG 2.1 2.2    ABG: Recent Labs  Lab 03/12/21 1350  PHART 7.423  PCO2ART 34.2  PO2ART 57.3*  HCO3 22.1  O2SAT 89.6    Liver Function Tests: No results for input(s): AST, ALT, ALKPHOS, BILITOT, PROT, ALBUMIN in the last 168 hours. No results for input(s): LIPASE, AMYLASE in the last 168 hours. No results for input(s): AMMONIA in the last 168 hours.  CBC: Recent Labs  Lab 03/07/21 0352 03/09/21 0452 03/11/21 0407 03/12/21 0423  WBC 13.5* 11.6* 14.4* 16.8*  HGB 12.9 13.1 13.8 14.2  HCT 39.8 39.2 41.7 42.8  MCV 95.0 95.1 93.3 94.1  PLT 163 164 178 188    Cardiac Enzymes: No results for input(s): CKTOTAL, CKMB, CKMBINDEX, TROPONINI in the last 168 hours.  BNP (last 3 results) Recent Labs    01/16/21 0917  01/23/21 0628 01/27/21 0245  BNP 69.3 40.3 21.4    ProBNP (last 3 results) No results for input(s): PROBNP in the last 8760 hours.  Radiological Exams: No results found.  Assessment/Plan Active Problems:   Multifocal pneumonia   Acute on chronic respiratory failure with hypoxia (HCC)   Acute respiratory distress syndrome (ARDS) due to COVID-19 virus (HCC)   COVID-19 virus infection   Acute on chronic respiratory failure with hypoxia patient is on assist control mode has been on 35% FiO2 tidal volume is 400.  She continues to not do well as far as being able to come down on the oxygen.  In my conversation with the husband he wants to continue doing what you are doing Aortic stenosis reviewed her chart she had an echocardiogram done back last year in November showed very severe aortic stenosis.  I think this is playing a role in her failure to wean cellulitis for cardiology to see the patient and see if we can optimize her cardiac status.  Her husband told me that they were told that she is not a candidate for any intervention COVID-19 virus infection this has been in recovery patient does have some residual changes. Multifocal pneumonia has been treated we will continue to follow along closely. ARDS supportive care   I have personally seen and evaluated the patient, evaluated laboratory and imaging results, formulated the assessment and plan and placed orders. The Patient requires high complexity decision making with multiple  systems involvement.  Rounds were done with the Respiratory Therapy Director and Staff therapists and discussed with nursing staff also.  Allyne Gee, MD [facility] Pulmonary Critical Care Medicine Sleep Medicine

## 2021-03-13 NOTE — Progress Notes (Signed)
Echocardiogram 2D Echocardiogram has been performed.  Christina Higgins 03/13/2021, 12:28 PM

## 2021-03-14 NOTE — Progress Notes (Signed)
Pulmonary Critical Care Medicine [facility] [place]   PULMONARY CRITICAL CARE SERVICE  PROGRESS NOTE     Christina Higgins  ATF:[id number]  DOB: [birth year]   DOA: 02/05/21  Referring Physician: Luna Kitchens, MD  HPI: Christina Higgins is a 71 y.o. female being followed for ventilator/airway/oxygen weaning Acute on Chronic Respiratory Failure.  Echocardiogram reviewed cardiology consultation reviewed discussed with the patient's husband at the bedside explained to him that really not much more that we can offer in addition her oxygen requirements have gone up now to 15 L  Medications: Reviewed on Rounds  Physical Exam:  Vitals: Temperature is 96.8 pulse 84 respiratory 22 blood pressure is 114/63 saturations 97%  Ventilator Settings on 15 L oxygen  General: Comfortable at this time Neck: supple Cardiovascular: no malignant arrhythmias Respiratory: No rhonchi very coarse distant breath sounds Skin: no rash seen on limited exam Musculoskeletal: No gross abnormality Psychiatric:unable to assess Neurologic:no involuntary movements         Lab Data:   Basic Metabolic Panel: Recent Labs  Lab 03/09/21 0452  NA 136  K 4.5  CL 100  CO2 27  GLUCOSE 208*  BUN 19  CREATININE 0.52  CALCIUM 9.3  MG 2.2    ABG: Recent Labs  Lab 03/12/21 1350  PHART 7.423  PCO2ART 34.2  PO2ART 57.3*  HCO3 22.1  O2SAT 89.6    Liver Function Tests: No results for input(s): AST, ALT, ALKPHOS, BILITOT, PROT, ALBUMIN in the last 168 hours. No results for input(s): LIPASE, AMYLASE in the last 168 hours. No results for input(s): AMMONIA in the last 168 hours.  CBC: Recent Labs  Lab 03/09/21 0452 03/11/21 0407 03/12/21 0423 03/13/21 2140  WBC 11.6* 14.4* 16.8* 17.8*  HGB 13.1 13.8 14.2 12.8  HCT 39.2 41.7 42.8 37.3  MCV 95.1 93.3 94.1 92.6  PLT 164 178 188 156    Cardiac Enzymes: No results for input(s): CKTOTAL, CKMB, CKMBINDEX, TROPONINI in the last 168  hours.  BNP (last 3 results) Recent Labs    01/16/21 0917 01/23/21 0628 01/27/21 0245  BNP 69.3 40.3 21.4    ProBNP (last 3 results) No results for input(s): PROBNP in the last 8760 hours.  Radiological Exams: US PELVIS LIMITED (TRANSABDOMINAL ONLY)  Result Date: 03/13/2021 CLINICAL DATA:  Evaluate for bladder mass. EXAM: LIMITED ULTRASOUND OF PELVIS TECHNIQUE: Limited transabdominal ultrasound examination of the pelvis was performed. COMPARISON:  CT abdomen and pelvis dated May 28, 2020. FINDINGS: Small amount of layering debris in the posterior bladder. No discrete mass. No significant wall thickening. Other findings:  None. IMPRESSION: 1. Small amount of layering debris in the posterior bladder. No discrete mass. Electronically Signed   By: Obie Dredge M.D.   On: 03/13/2021 18:56   DG CHEST PORT 1 VIEW  Result Date: 03/13/2021 CLINICAL DATA:  CHF EXAM: PORTABLE CHEST 1 VIEW COMPARISON:  03/07/2021 FINDINGS: Cardiac silhouette is again at the upper limits of normal size. Mediastinal contours are grossly within normal limits with calcification again seen within the aortic arch. Mildly decreased lung volumes, similar to prior. Diffuse bilateral moderate interstitial thickening, similar to prior. Mild bilateral patchy heterogeneous airspace opacities are similar to prior. No large pleural effusion is seen. No pneumothorax. No acute skeletal abnormality. IMPRESSION: No significant change in moderate bilateral interstitial pulmonary edema. Electronically Signed   By: Neita Garnet   On: 03/13/2021 10:19   ECHOCARDIOGRAM COMPLETE  Result Date: 03/13/2021    ECHOCARDIOGRAM REPORT   Patient Name:  Christina Higgins Date of Exam: 03/13/2021 Medical Rec #:  [id number]           Height:       60.0 in Accession #:    [id number]          Weight:       144.0 lb Date of Birth:  [birth year]           BSA:          1.623 m Patient Age:    70 years            BP:           97/63 mmHg Patient Gender: F                    HR:           94 bpm. Exam Location:  Inpatient Procedure: 2D Echo Indications:    Aortic stenosis  History:        Patient has prior history of Echocardiogram examinations, most                 recent 01/22/2021. Risk Factors:Diabetes.  Sonographer:    Eduard Rouxolleen Schwartz Referring Phys: [phone] Mylo Driskill A Ladislav Caselli  Sonographer Comments: Technically difficult study due to poor echo windows, no parasternal window and no subcostal window. IMPRESSIONS  1. Left ventricular ejection fraction, by estimation, is 70 to 75%. The left ventricle has hyperdynamic function. The left ventricle has no regional wall motion abnormalities. Left ventricular diastolic function could not be evaluated.  2. Right ventricular systolic function is hyperdynamic. The right ventricular size is normal.  3. The mitral valve was not well visualized. No evidence of mitral valve regurgitation. No evidence of mitral stenosis. Moderate mitral annular calcification.  4. The aortic valve was not well visualized. Aortic valve regurgitation is not visualized. Moderate aortic valve stenosis.  5. Very limited echo due to poor sound wave transmission. LV is small and underfilled with hyperdynamic function. The mitral annulus is calcified. There is an echodense structure adjacent to the anterior leaflet of the mitral valve which is likely annular calcification but if there is clinical suspicion for endocarditis which suggest TEE to further evalaute. FINDINGS  Left Ventricle: Left ventricular ejection fraction, by estimation, is 70 to 75%. The left ventricle has hyperdynamic function. The left ventricle has no regional wall motion abnormalities. The left ventricular internal cavity size was normal in size. There is no left ventricular hypertrophy. Left ventricular diastolic function could not be evaluated. Right Ventricle: The right ventricular size is normal. No increase in right ventricular wall thickness. Right ventricular systolic function is  hyperdynamic. Left Atrium: Left atrial size was normal in size. Right Atrium: Right atrial size was normal in size. Pericardium: The pericardium was not well visualized. Mitral Valve: The mitral valve was not well visualized. Moderate mitral annular calcification. No evidence of mitral valve regurgitation. No evidence of mitral valve stenosis. Tricuspid Valve: The tricuspid valve is not well visualized. Tricuspid valve regurgitation is not demonstrated. No evidence of tricuspid stenosis. Aortic Valve: The aortic valve was not well visualized. Aortic valve regurgitation is not visualized. Moderate aortic stenosis is present. Aortic valve mean gradient measures 20.5 mmHg. Aortic valve peak gradient measures 43.8 mmHg. Pulmonic Valve: The pulmonic valve was not well visualized. Pulmonic valve regurgitation is not visualized. No evidence of pulmonic stenosis. Aorta: The aortic root is normal in size and structure and the aortic root was not well visualized. Venous: The inferior vena  cava was not well visualized. IAS/Shunts: No atrial level shunt detected by color flow Doppler.   Diastology LV e' medial:   5.93 cm/s LV E/e' medial: 7.7  LEFT ATRIUM             Index        RIGHT ATRIUM          Index LA Vol (A2C):   18.6 ml 11.46 ml/m  RA Area:     7.62 cm LA Vol (A4C):   30.2 ml 18.61 ml/m  RA Volume:   13.60 ml 8.38 ml/m LA Biplane Vol: 24.6 ml 15.16 ml/m  AORTIC VALVE AV Vmax:           331.00 cm/s AV Vmean:          204.000 cm/s AV VTI:            0.462 m AV Peak Grad:      43.8 mmHg AV Mean Grad:      20.5 mmHg LVOT Vmax:         141.00 cm/s LVOT Vmean:        72.600 cm/s LVOT VTI:          0.216 m LVOT/AV VTI ratio: 0.47 MITRAL VALVE MV Area (PHT): 5.42 cm    SHUNTS MV Decel Time: 140 msec    Systemic VTI: 0.22 m MV E velocity: 45.40 cm/s MV A velocity: 88.70 cm/s MV E/A ratio:  0.51 Arvilla Meres MD Electronically signed by Arvilla Meres MD Signature Date/Time: 03/13/2021/5:57:21 PM    Final      Assessment/Plan Active Problems:   Multifocal pneumonia   Acute on chronic respiratory failure with hypoxia (HCC)   Acute respiratory distress syndrome (ARDS) due to COVID-19 virus (HCC)   COVID-19 virus infection   Acute on chronic respiratory failure with hypoxia the patient is going to continue with oxygen therapy prognosis is poor as already mentioned Multifocal pneumonia has been treated with antibiotics ARDS now improvement is noted. COVID-19 virus infection in recovery Aortic stenosis cardiology has seen the patient nothing else that can be offered at this time   I have personally seen and evaluated the patient, evaluated laboratory and imaging results, formulated the assessment and plan and placed orders. The Patient requires high complexity decision making with multiple systems involvement.  Rounds were done with the Respiratory Therapy Director and Staff therapists and discussed with nursing staff also.  Yevonne Pax, MD [facility] Pulmonary Critical Care Medicine Sleep Medicine

## 2021-03-15 LAB — BASIC METABOLIC PANEL
Anion gap: 8 (ref 5–15)
BUN: 13 mg/dL (ref 8–23)
CO2: 24 mmol/L (ref 22–32)
Calcium: 8.8 mg/dL — ABNORMAL LOW (ref 8.9–10.3)
Chloride: 102 mmol/L (ref 98–111)
Creatinine, Ser: 0.49 mg/dL (ref 0.44–1.00)
GFR, Estimated: >60 mL/min (ref >60)
Glucose, Bld: 201 mg/dL — ABNORMAL HIGH (ref 70–99)
Potassium: 4.5 mmol/L (ref 3.5–5.1)
Sodium: 134 mmol/L — ABNORMAL LOW (ref 135–145)

## 2021-03-15 LAB — MAGNESIUM: Magnesium: 2.1 mg/dL (ref 1.7–2.4)

## 2021-03-15 LAB — CBC
HCT: 35.5 % — ABNORMAL LOW (ref 36.0–46.0)
Hemoglobin: 11.8 g/dL — ABNORMAL LOW (ref 12.0–15.0)
MCH: 31.2 pg (ref 26.0–34.0)
MCHC: 33.2 g/dL (ref 30.0–36.0)
MCV: 93.9 fL (ref 80.0–100.0)
Platelets: UNDETERMINED 10*3/uL (ref 150–400)
RBC: 3.78 MIL/uL — ABNORMAL LOW (ref 3.87–5.11)
RDW: 16.4 % — ABNORMAL HIGH (ref 11.5–15.5)
WBC: 11.5 10*3/uL — ABNORMAL HIGH (ref 4.0–10.5)
nRBC: 0 % (ref 0.0–0.2)

## 2021-03-15 LAB — BRAIN NATRIURETIC PEPTIDE: B Natriuretic Peptide: 49.9 pg/mL (ref 0.0–100.0)

## 2021-03-16 LAB — CULTURE, BLOOD (ROUTINE X 2)
Culture: NO GROWTH
Culture: NO GROWTH
Special Requests: ADEQUATE
Special Requests: ADEQUATE

## 2021-03-16 NOTE — Progress Notes (Signed)
Pulmonary Critical Care Medicine [facility] [place]   PULMONARY CRITICAL CARE SERVICE  PROGRESS NOTE     Christina Higgins  TIW:[id number]  DOB: August 06, 1950   DOA: 2021/02/13  Referring Physician: Luna Kitchens, MD  HPI: Christina Higgins is a 71 y.o. female being followed for ventilator/airway/oxygen weaning Acute on Chronic Respiratory Failure.  Patient is currently on 50% FiO2 and 15 L has had significant decline.  I spoke with the patient's husband and he understands that she is not doing well and does not want CPR and he does not want her to be intubated.  He understands that she may likely pass  Medications: Reviewed on Rounds  Physical Exam:  Vitals: Temperature 96.3 pulse 84 respiratory 24 blood pressure is 117/73 saturations 98%  Ventilator Settings on 50% FiO2 50 L flow  General: Comfortable at this time Neck: supple Cardiovascular: no malignant arrhythmias Respiratory: Scattered rhonchi very coarse breath sounds Skin: no rash seen on limited exam Musculoskeletal: No gross abnormality Psychiatric:unable to assess Neurologic:no involuntary movements         Lab Data:   Basic Metabolic Panel: Recent Labs  Lab 03/15/21 0517  NA 134*  K 4.5  CL 102  CO2 24  GLUCOSE 201*  BUN 13  CREATININE 0.49  CALCIUM 8.8*  MG 2.1    ABG: Recent Labs  Lab 03/12/21 1350  PHART 7.423  PCO2ART 34.2  PO2ART 57.3*  HCO3 22.1  O2SAT 89.6    Liver Function Tests: No results for input(s): AST, ALT, ALKPHOS, BILITOT, PROT, ALBUMIN in the last 168 hours. No results for input(s): LIPASE, AMYLASE in the last 168 hours. No results for input(s): AMMONIA in the last 168 hours.  CBC: Recent Labs  Lab 03/11/21 0407 03/12/21 0423 03/13/21 2140 03/15/21 0517  WBC 14.4* 16.8* 17.8* 11.5*  HGB 13.8 14.2 12.8 11.8*  HCT 41.7 42.8 37.3 35.5*  MCV 93.3 94.1 92.6 93.9  PLT 178 188 156 PLATELET CLUMPS NOTED ON SMEAR, UNABLE TO ESTIMATE    Cardiac  Enzymes: No results for input(s): CKTOTAL, CKMB, CKMBINDEX, TROPONINI in the last 168 hours.  BNP (last 3 results) Recent Labs    01/23/21 0628 01/27/21 0245 03/15/21 0517  BNP 40.3 21.4 49.9    ProBNP (last 3 results) No results for input(s): PROBNP in the last 8760 hours.  Radiological Exams: No results found.  Assessment/Plan Active Problems:   Multifocal pneumonia   Acute on chronic respiratory failure with hypoxia (HCC)   Acute respiratory distress syndrome (ARDS) due to COVID-19 virus (HCC)   COVID-19 virus infection   Acute on chronic respiratory failure with hypoxia she has had a steady decline in status now on higher oxygen requirements plan is going to be to continue with oxygen therapy and supportive care she is not to be intubated and she has been not to have CPR per patient and husband's request Multifocal pneumonia has been treated we will continue to monitor along closely. Acute respiratory distress no improvement COVID-19 virus infection has been treated Aortic stenosis cardiology consultation appreciated   I have personally seen and evaluated the patient, evaluated laboratory and imaging results, formulated the assessment and plan and placed orders. The Patient requires high complexity decision making with multiple systems involvement.  Rounds were done with the Respiratory Therapy Director and Staff therapists and discussed with nursing staff also.  Yevonne Pax, MD [facility] Pulmonary Critical Care Medicine Sleep Medicine

## 2021-03-17 NOTE — Progress Notes (Signed)
Pulmonary Critical Care Medicine [facility] [place]   PULMONARY CRITICAL CARE SERVICE  PROGRESS NOTE     Christina Higgins  RJJ:[id number]  DOB: October 07, 1950   DOA: 01/29/2021  Referring Physician: Luna Kitchens, MD  HPI: Christina Higgins is a 71 y.o. female being followed for ventilator/airway/oxygen weaning Acute on Chronic Respiratory Failure.  She continues to be on oxygen therapy 15 L and also supplemental Ventimask  Medications: Reviewed on Rounds  Physical Exam:  Vitals: Temperature is 97.1 pulse 91 respiratory 27 blood pressure is 104/68  Ventilator Settings on 15 L Ventimask  General: Comfortable at this time Neck: supple Cardiovascular: no malignant arrhythmias Respiratory: Scattered rhonchi expansion is equal Skin: no rash seen on limited exam Musculoskeletal: No gross abnormality Psychiatric:unable to assess Neurologic:no involuntary movements         Lab Data:   Basic Metabolic Panel: Recent Labs  Lab 03/15/21 0517  NA 134*  K 4.5  CL 102  CO2 24  GLUCOSE 201*  BUN 13  CREATININE 0.49  CALCIUM 8.8*  MG 2.1    ABG: Recent Labs  Lab 03/12/21 1350  PHART 7.423  PCO2ART 34.2  PO2ART 57.3*  HCO3 22.1  O2SAT 89.6    Liver Function Tests: No results for input(s): AST, ALT, ALKPHOS, BILITOT, PROT, ALBUMIN in the last 168 hours. No results for input(s): LIPASE, AMYLASE in the last 168 hours. No results for input(s): AMMONIA in the last 168 hours.  CBC: Recent Labs  Lab 03/11/21 0407 03/12/21 0423 03/13/21 2140 03/15/21 0517  WBC 14.4* 16.8* 17.8* 11.5*  HGB 13.8 14.2 12.8 11.8*  HCT 41.7 42.8 37.3 35.5*  MCV 93.3 94.1 92.6 93.9  PLT 178 188 156 PLATELET CLUMPS NOTED ON SMEAR, UNABLE TO ESTIMATE    Cardiac Enzymes: No results for input(s): CKTOTAL, CKMB, CKMBINDEX, TROPONINI in the last 168 hours.  BNP (last 3 results) Recent Labs    01/23/21 0628 01/27/21 0245 03/15/21 0517  BNP 40.3 21.4 49.9     ProBNP (last 3 results) No results for input(s): PROBNP in the last 8760 hours.  Radiological Exams: No results found.  Assessment/Plan Active Problems:   Multifocal pneumonia   Acute on chronic respiratory failure with hypoxia (HCC)   Acute respiratory distress syndrome (ARDS) due to COVID-19 virus (HCC)   COVID-19 virus infection   Acute on chronic respiratory failure hypoxia patient currently is on 15 L oxygen doing very poorly Multifocal pneumonia treated with antibiotics COVID-19 virus infection in recovery ARDS very slow to improve Oxygen dependent   I have personally seen and evaluated the patient, evaluated laboratory and imaging results, formulated the assessment and plan and placed orders. The Patient requires high complexity decision making with multiple systems involvement.  Rounds were done with the Respiratory Therapy Director and Staff therapists and discussed with nursing staff also.  Yevonne Pax, MD [facility] Pulmonary Critical Care Medicine Sleep Medicine

## 2021-04-02 DEATH — deceased

## 2021-05-21 ENCOUNTER — Ambulatory Visit: Payer: Medicare Other | Admitting: Cardiovascular Disease

## 2022-12-28 IMAGING — CT CT ABD-PELV W/ CM
1 of 3 series · 12 of 32 positions shown, 18 images · IV contrast (iopamidol)
Comparison: None.

CLINICAL DATA: Left lower quadrant pain. Symptoms for 2 years. Two
ligation. No malignancy history. Diabetes.

EXAM:
CT ABDOMEN AND PELVIS WITH CONTRAST
TECHNIQUE: Multidetector CT imaging of the abdomen and pelvis was performed
using the standard protocol following bolus administration of
intravenous contrast.
CONTRAST:  100mL OQOLGR-N11 IOPAMIDOL (OQOLGR-N11) INJECTION 61%

[Series 2: abd/pelvis w/cm · axial · 0.82mm/px · z∈[-390,-16]mm · 12 of 89 slices shown, 18 images]
[im 7/89  soft-tissue]
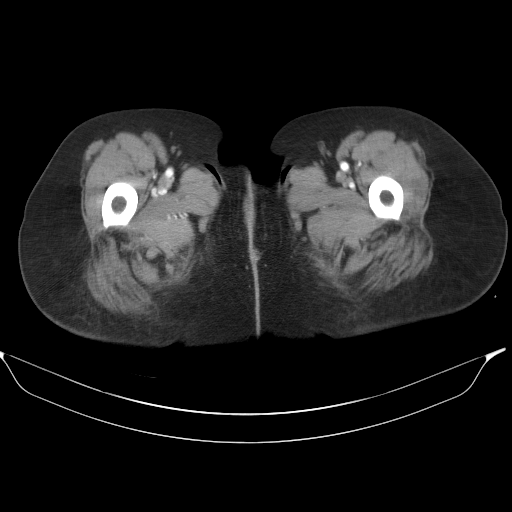
[im 7/89  bone]
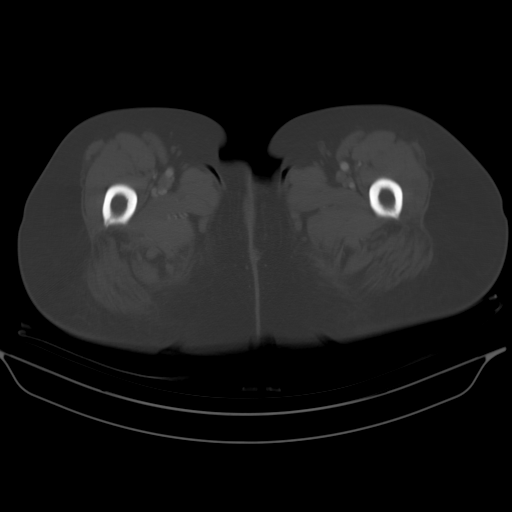
[im 13/89  soft-tissue]
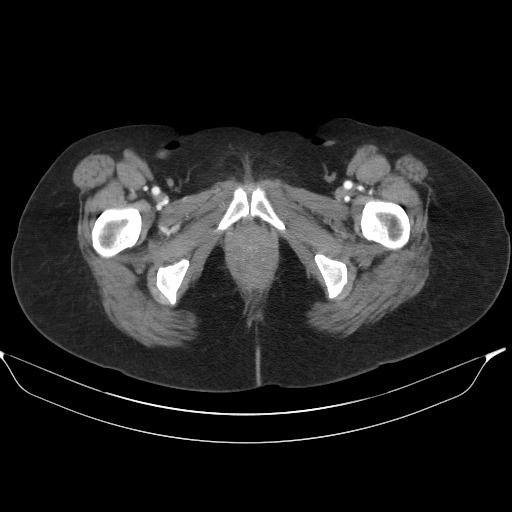
[im 19/89  soft-tissue]
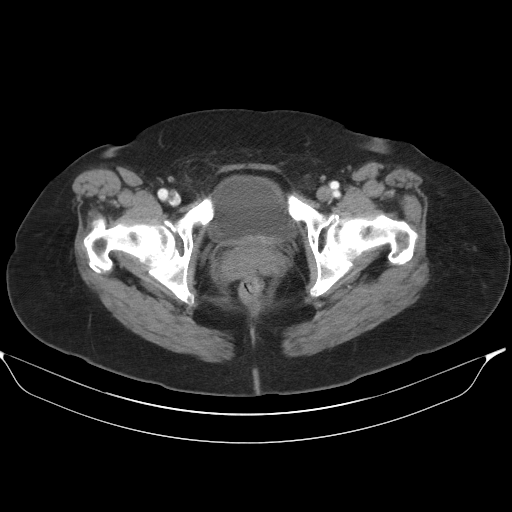
[im 26/89  soft-tissue]
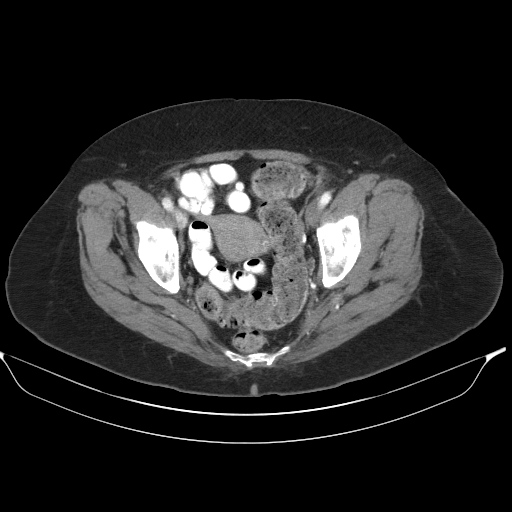
[im 32/89  soft-tissue]
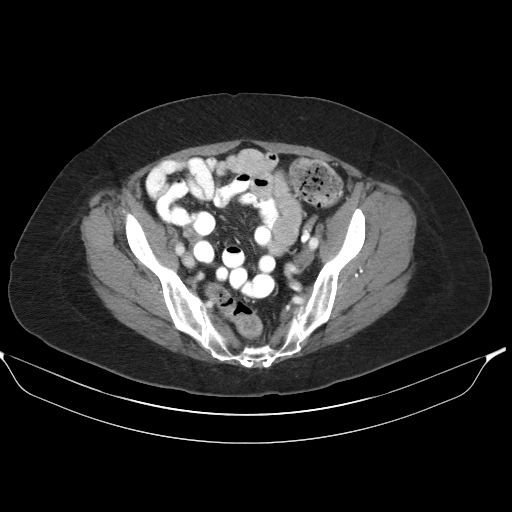
[im 38/89  soft-tissue]
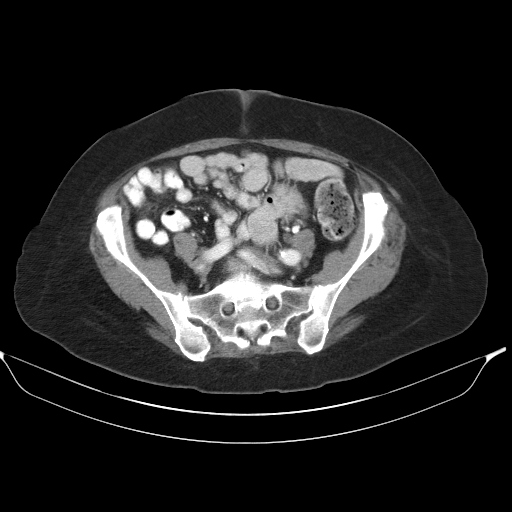
[im 51/89  soft-tissue]
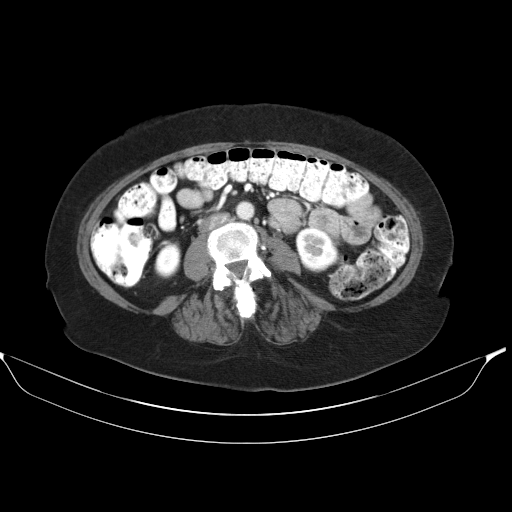
[im 57/89  soft-tissue]
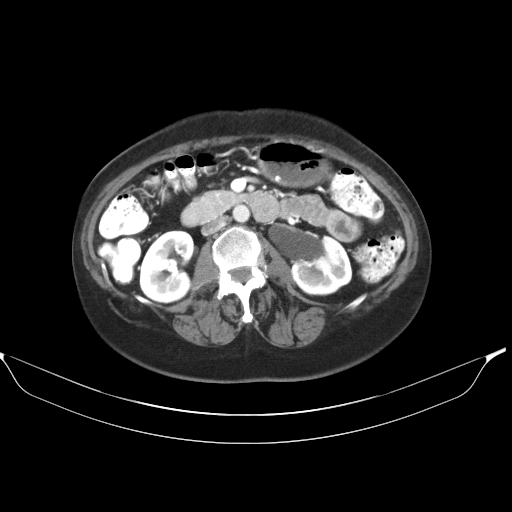
[im 63/89  soft-tissue]
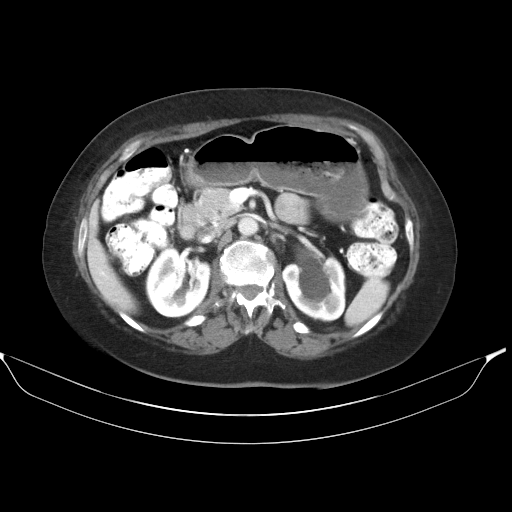
[im 63/89  lung]
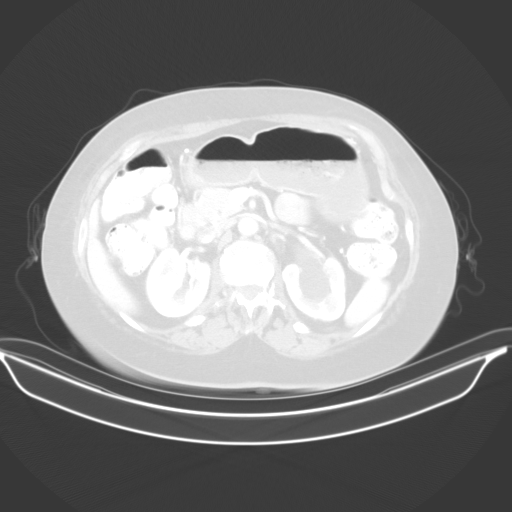
[im 63/89  bone]
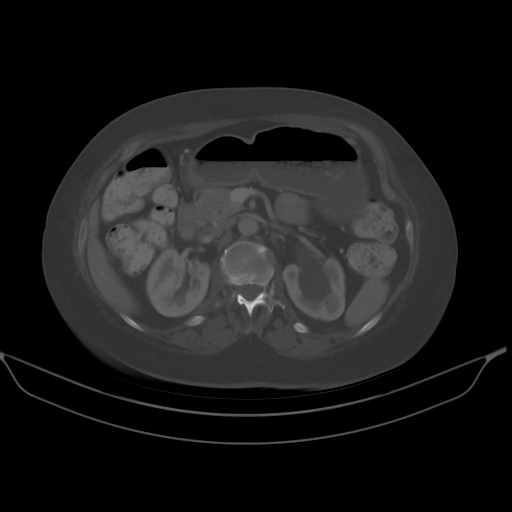
[im 70/89  soft-tissue]
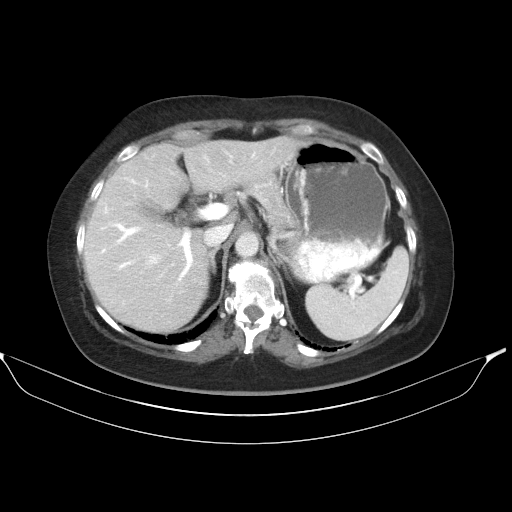
[im 70/89  lung]
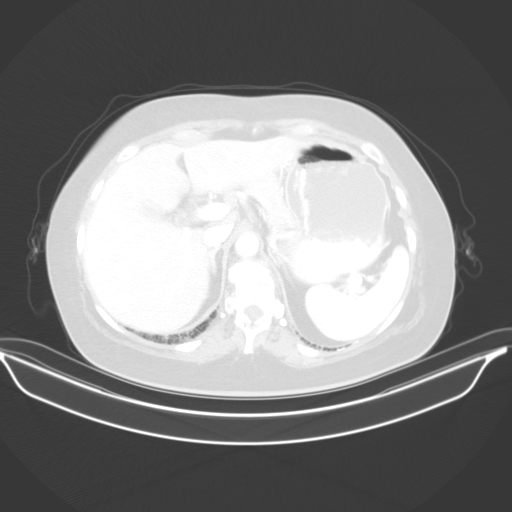
[im 76/89  soft-tissue]
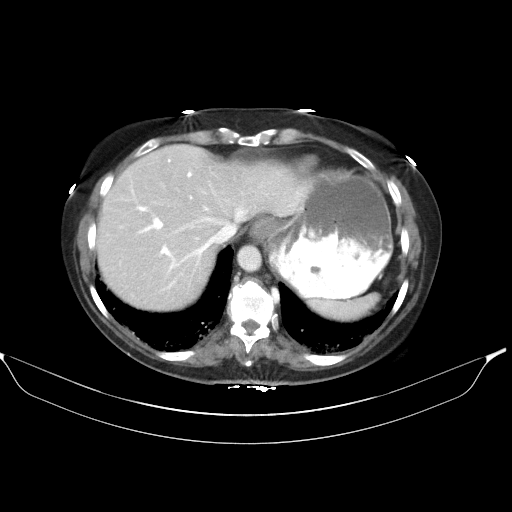
[im 76/89  lung]
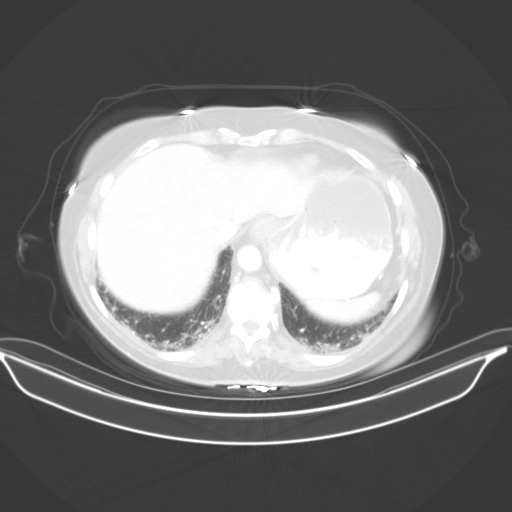
[im 82/89  soft-tissue]
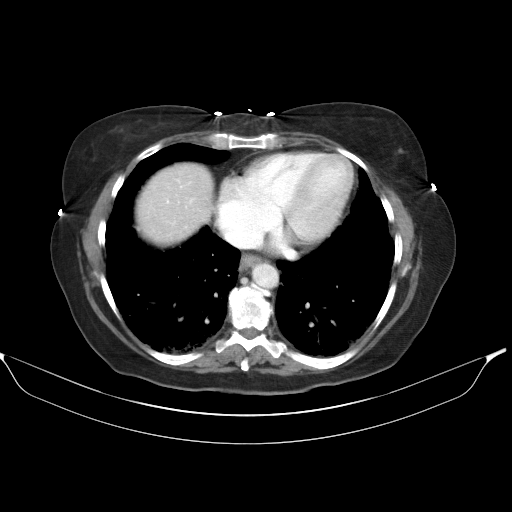
[im 82/89  lung]
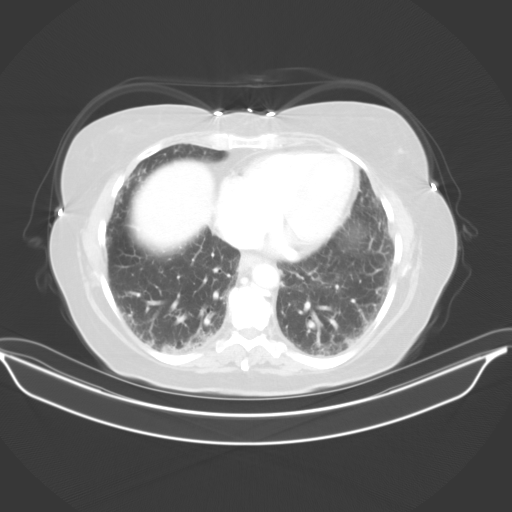

[12 of 32 positions shown; findings below may reference images not displayed]

FINDINGS: Lower chest: Bilateral lower lobe subsegmental atelectasis.
Peripheral mild reticulations. Aortic root calcifications.

Hepatobiliary: No focal liver abnormality. No gallstones,
gallbladder wall thickening, or pericholecystic fluid. No biliary
dilatation.

Pancreas: No focal lesion. Normal pancreatic contour. No surrounding
inflammatory changes. No main pancreatic ductal dilatation.

Spleen: Normal in size without focal abnormality.

Adrenals/Urinary Tract:

No adrenal nodule bilaterally.

Mildly delayed left nephrogram. Severe left hydronephrosis as well
as moderate hydroureter with couple of consecutive left
ureterovesicular junction stones measuring up to 7 mm. Associated
ureteral wall thickening/enhancement. No right hydronephrosis. No
right hydroureter. No nephrolithiasis bilaterally. No right
ureterolithiasis.

The urinary bladder is unremarkable.

On delayed imaging, there is no filling defects in the opacified
portions of the right collecting systems or ureters. There is
delayed excretion of intravenous contrast noted on the left.

Stomach/Bowel: PO contrast reaches the splenic flexure. Stomach is
within normal limits. No evidence of bowel wall thickening or
dilatation. Stool throughout the colon. Appendix appears normal.

Vascular/Lymphatic: Left ovarian vein congestion. No abdominal aorta
or iliac aneurysm. Atherosclerotic plaque of the iliac arteries. No
abdominal, pelvic, or inguinal lymphadenopathy.

Reproductive: Uterus and bilateral adnexal regions are unremarkable.

Other: No intraperitoneal free fluid. No intraperitoneal free gas.
No organized fluid collection.

Musculoskeletal:

Question tiny bilateral inguinal hernias containing fat.

No suspicious lytic or blastic osseous lesions. No acute displaced
fracture. Multilevel degenerative changes of the spine. Grade 1
anterolisthesis of L4 on L5 and L5 on S1.
IMPRESSION: Couple of consecutive obstructed left ureterovesicular junction
stones measuring up to 7 mm with findings suggestive of superimposed
infection.

These results will be called to the ordering clinician or
representative by the Radiologist Assistant, and communication
documented in the PACS or [REDACTED].

## 2023-10-13 IMAGING — US US PELVIS LIMITED
1 series · 13 of 13 positions shown · non-contrast
Comparison: CT abdomen and pelvis dated May 28, 2020.

CLINICAL DATA: Evaluate for bladder mass.

EXAM:
LIMITED ULTRASOUND OF PELVIS
TECHNIQUE: Limited transabdominal ultrasound examination of the pelvis was
performed.

[Series 1: us pelvis limited (transabdominal only) · 13 acquisitions, 13 frames shown]
[im 1/13]
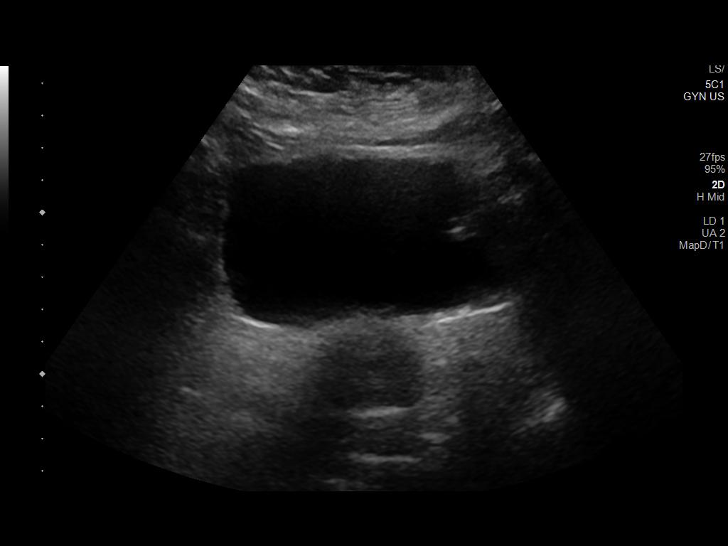
[im 2/13]
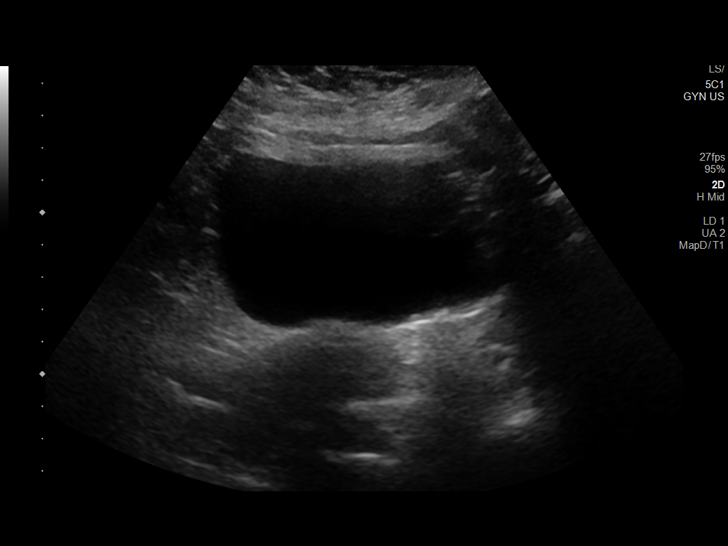
[im 3/13]
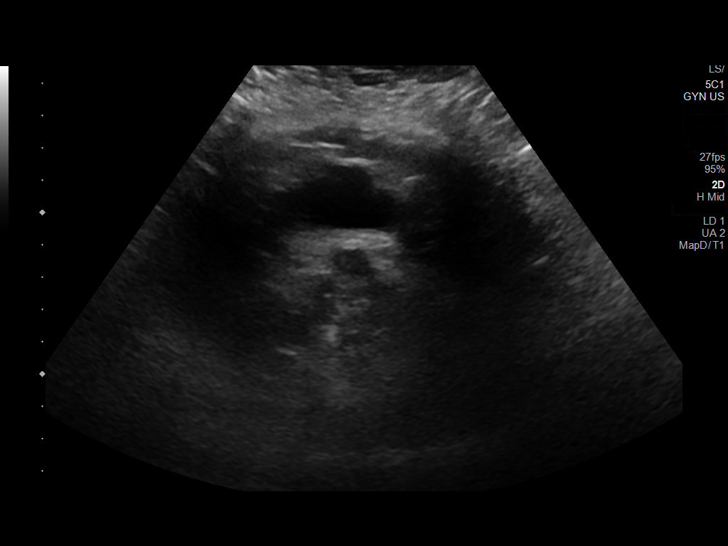
[im 4/13]
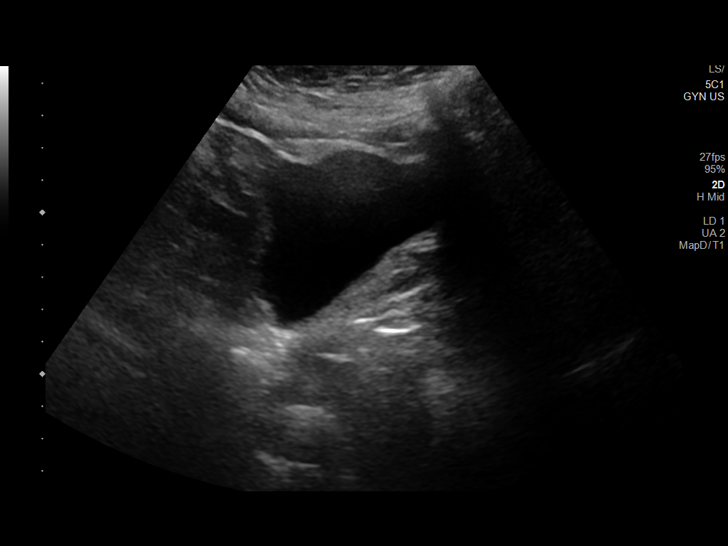
[im 5/13]
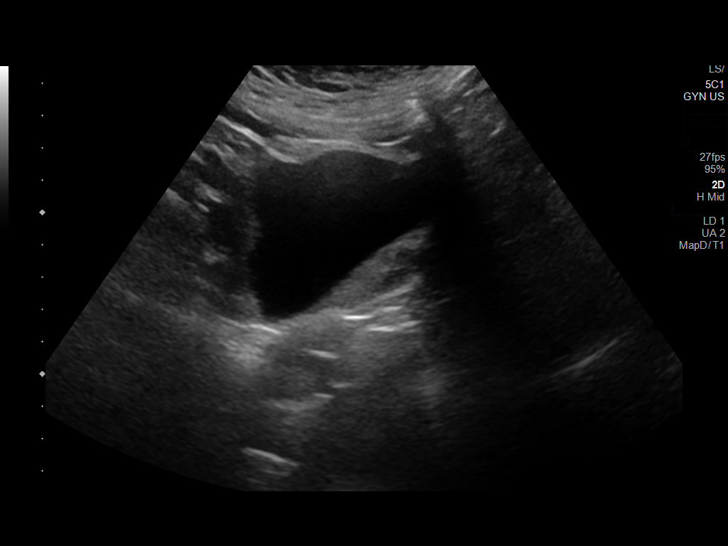
[im 6/13]
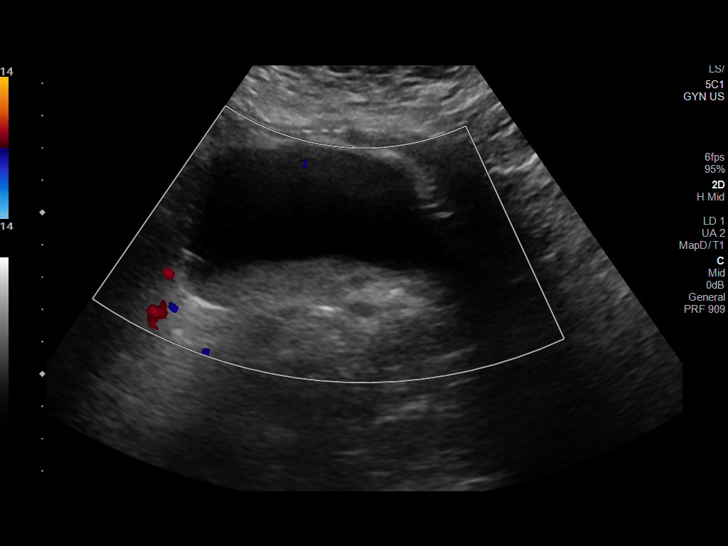
[im 7/13]
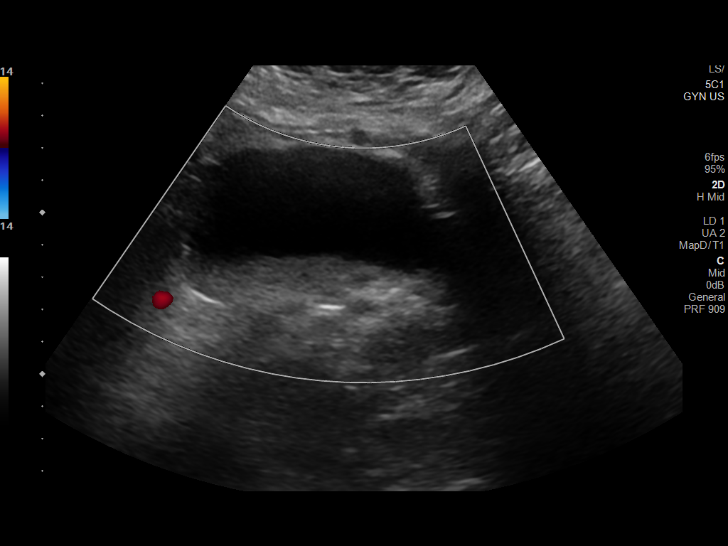
[im 8/13]
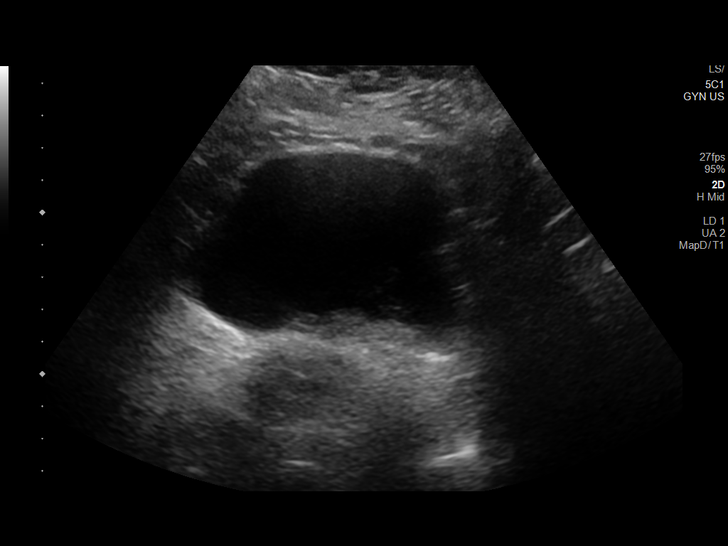
[im 9/13]
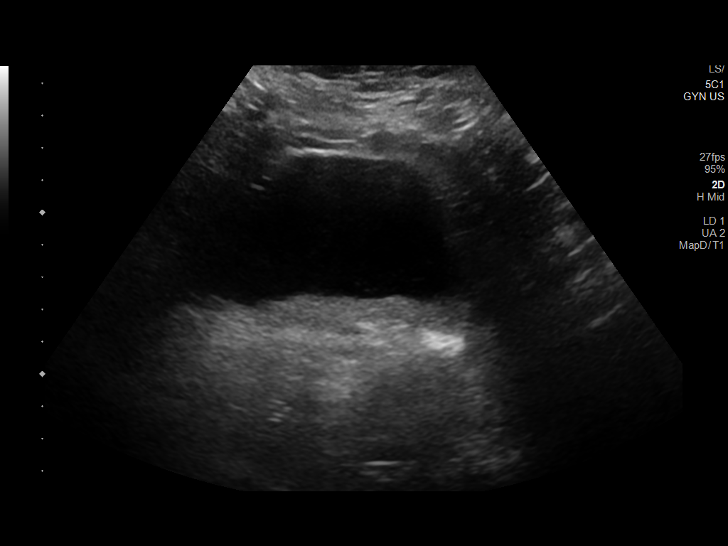
[im 10/13]
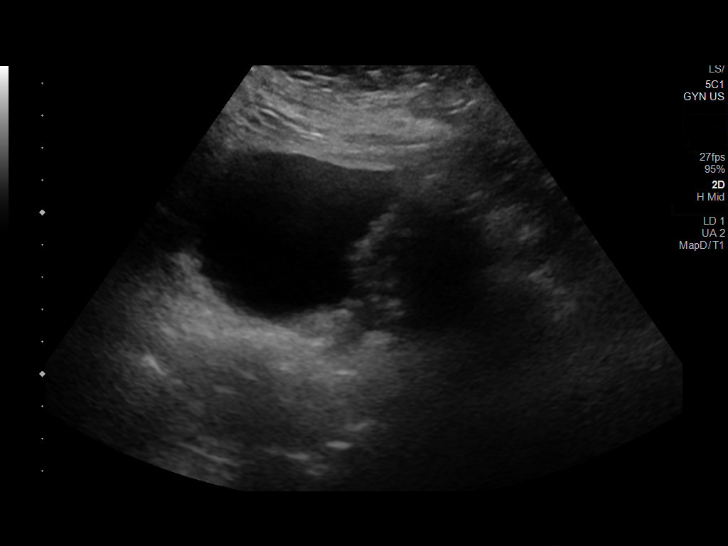
[im 11/13]
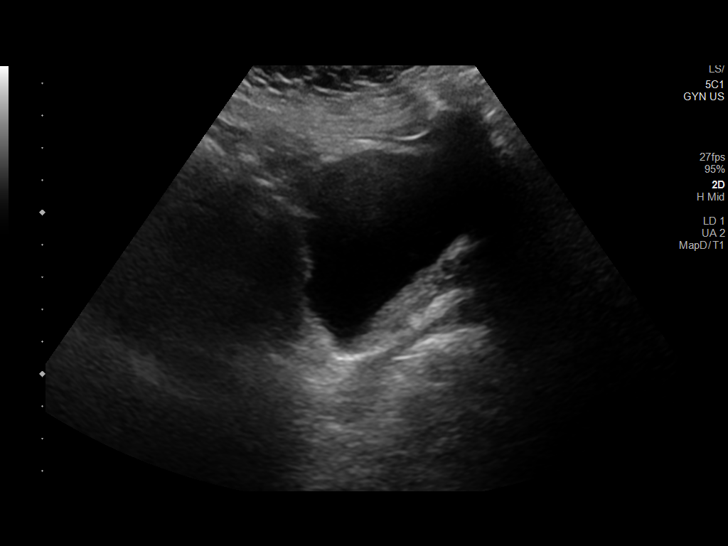
[im 12/13]
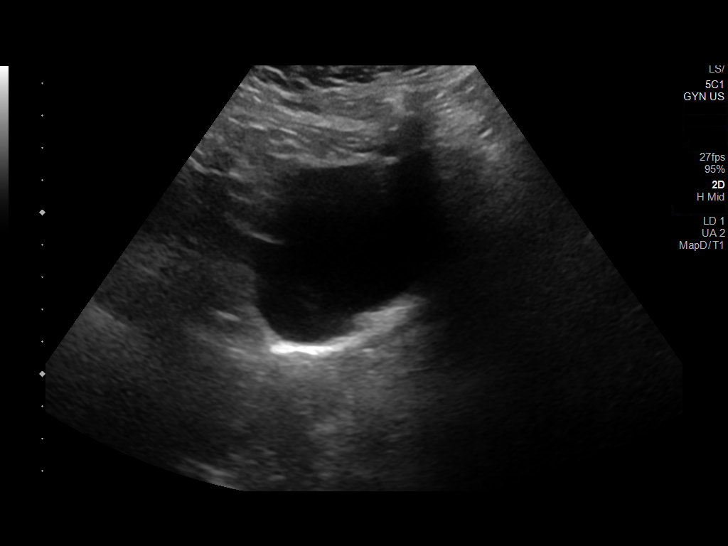
[im 13/13]
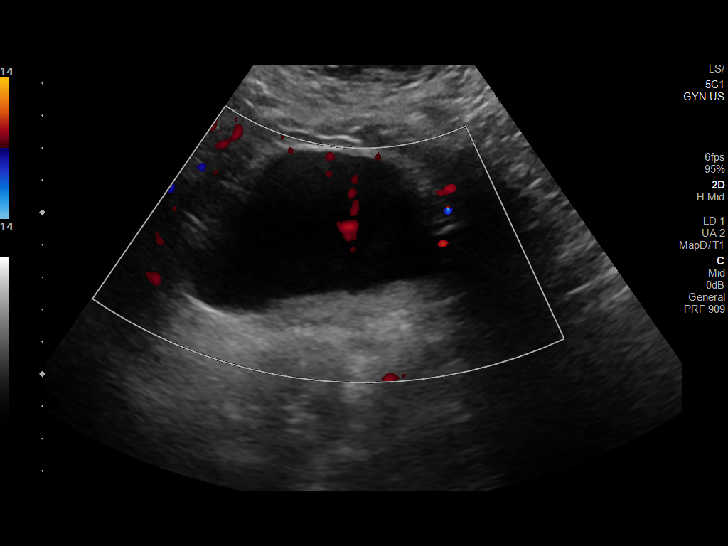

[13 of 13 positions shown; findings below may reference images not displayed]

FINDINGS: Small amount of layering debris in the posterior bladder. No
discrete mass. No significant wall thickening.

Other findings:  None.
IMPRESSION: 1. Small amount of layering debris in the posterior bladder. No
discrete mass.
